# Patient Record
Sex: Male | Born: 1960 | Race: White | Hispanic: No | Marital: Single | Smoking: Never smoker
Health system: Southern US, Community
[De-identification: ages and names within clinical notes are randomized; demographics above are authoritative.]

## PROBLEM LIST (undated history)

## (undated) DIAGNOSIS — G4730 Sleep apnea, unspecified: Secondary | ICD-10-CM

## (undated) HISTORY — PX: TONSILLECTOMY: SUR1361

---

## 2019-10-17 HISTORY — PX: TRANSTHORACIC ECHOCARDIOGRAM: SHX275

## 2019-10-20 HISTORY — PX: CARDIAC CATHETERIZATION: SHX172

## 2020-10-06 ENCOUNTER — Telehealth: Payer: Self-pay | Admitting: Specialist

## 2020-10-07 ENCOUNTER — Ambulatory Visit: Payer: Self-pay

## 2020-10-07 ENCOUNTER — Ambulatory Visit (INDEPENDENT_AMBULATORY_CARE_PROVIDER_SITE_OTHER): Payer: BC Managed Care – PPO | Admitting: Specialist

## 2020-10-07 ENCOUNTER — Encounter: Payer: Self-pay | Admitting: Specialist

## 2020-10-07 ENCOUNTER — Other Ambulatory Visit: Payer: Self-pay

## 2020-10-07 VITALS — BP 155/98 | HR 82 | Ht 66.0 in | Wt 250.0 lb

## 2020-10-07 DIAGNOSIS — M545 Low back pain, unspecified: Secondary | ICD-10-CM

## 2020-10-07 DIAGNOSIS — M5136 Other intervertebral disc degeneration, lumbar region: Secondary | ICD-10-CM | POA: Diagnosis not present

## 2020-10-07 DIAGNOSIS — M1712 Unilateral primary osteoarthritis, left knee: Secondary | ICD-10-CM

## 2020-10-07 DIAGNOSIS — M47816 Spondylosis without myelopathy or radiculopathy, lumbar region: Secondary | ICD-10-CM

## 2020-10-07 DIAGNOSIS — M25562 Pain in left knee: Secondary | ICD-10-CM | POA: Diagnosis not present

## 2020-10-07 MED ORDER — DICLOFENAC SODIUM 1 % EX GEL: 4.0000 g | Freq: Four times a day (QID) | CUTANEOUS | 5 refills | Status: DC

## 2020-10-07 NOTE — Progress Notes (Signed)
Office Visit Note   Patient: Malik James           Date of Birth: 1961-01-29           MRN: [id number] Visit Date: 10/07/2020              Requested by: No referring provider defined for this encounter. PCP: No primary care provider on file.   Assessment & Plan: Visit Diagnoses:  1. Left low back pain, unspecified chronicity, unspecified whether sciatica present   2. Left knee pain, unspecified chronicity   3. Unilateral primary osteoarthritis, left knee   4. Degenerative disc disease, lumbar   5. Spondylosis without myelopathy or radiculopathy, lumbar region     Plan: Avoid frequent bending and stooping  No lifting greater than 10 lbs. May use ice or moist heat for pain. Weight loss is of benefit. Best medication for lumbar disc disease is arthritis medications like motrin, celebrex and naprosyn. Exercise is important to improve your indurance and does allow people to function better inspite of back pain. Plan: Knee is suffering from osteoarthritis, only real proven treatments are Weight loss, NSIADs like diclofenac and exercise. Well padded shoes help. Ice the knee that is suffering from osteoarthritis, only real proven treatments are Weight loss, NSIADs like diclofenac and exercise. Well padded shoes help. Ice the knee 2-3 times a day 15-20 mins at a time 3 times a day 15-20 mins at a time. Hot showers in the AM.  Injection with steroid may be of benefit. Hemp CBD capsules refined and does not cause test to be positive for [email or web address] 5,000-7,000 mg per bottle, 60 capsules per bottle, take one capsule twice a day. Cane in the left hand to use with left leg weight bearing. Follow-Up Instructions: No follow-ups on file.    Follow-Up Instructions: Return in about 6 weeks (around 11/18/2020).   Orders:  Orders Placed This Encounter  Procedures  . XR Lumbar Spine 2-3 Views  . XR Knee 1-2 Views Left   Meds ordered this encounter  Medications  . diclofenac Sodium  (VOLTAREN) 1 % GEL    Sig: Apply 4 g topically 4 (four) times daily.    Dispense:  350 g    Refill:  5      Procedures: No procedures performed   Clinical Data: No additional findings.   Subjective: Chief Complaint  Patient presents with  . Left Hip - Pain  . Left Knee - Pain    60 year old right handed male with history of left buttock pain and pain into the left knee. Had a cortisone injection about  3 years ago in [place] and that helped for about 3 weeks. The injection was by [facility] at [facility]. No numbness or tingling into the left leg. AM stiffness and pain. Both back and leg are painful with the first step, like getting out of the vehicle. He has same pain with prolong sitting and stooping at the dishes. No bowel or bladder difficulty. 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  Had left SI joint injection done 08/24/2020 and it lasted a couple of months and he reports was out of work with COVID and was not sure the shot was the reason for relief.  Pain in the knee is worsening so he wanted  to have both assessed. Has sleep apnea and is awaiting a CPAP machine, 4-6 weeks and has no night pain. No pain with bending stooping or lifting. Squatting and kneeling with pain left knee andwith Stair. Had cardiac catheterization done last year returned normal.     Review of Systems  Constitutional: Negative.   HENT: Negative.   Eyes: Negative.   Respiratory: Negative.   Cardiovascular: Negative.   Gastrointestinal: Negative.   Endocrine: Negative.   Genitourinary: Negative.   Musculoskeletal: Negative.   Skin: Negative.   Allergic/Immunologic: Negative.   Neurological: Negative.   Hematological: Negative.   Psychiatric/Behavioral: Negative.      Objective: Vital Signs: BP (!) 155/98 (BP Location:  Left Arm, Patient Position: Sitting)   Pulse 82   Ht 5\' 6"  (1.676 m)   Wt 250 lb (113.4 kg)   BMI 40.35 kg/m   Physical Exam Constitutional:      Appearance: He is well-developed and well-nourished.  HENT:     Head: Normocephalic and atraumatic.  Eyes:     Extraocular Movements: EOM normal.     Pupils: Pupils are equal, round, and reactive to light.  Pulmonary:     Effort: Pulmonary effort is normal.     Breath sounds: Normal breath sounds.  Abdominal:     General: Bowel sounds are normal.     Palpations: Abdomen is soft.  Musculoskeletal:     Cervical back: Normal range of motion and neck supple.     Left knee:     Instability Tests: Medial McMurray test positive. Lateral McMurray test negative.  Skin:    General: Skin is warm and dry.  Neurological:     Mental Status: He is alert and oriented to person, place, and time.  Psychiatric:        Mood and Affect: Mood and affect normal.        Behavior: Behavior normal.        Thought Content: Thought content normal.        Judgment: Judgment normal.     Left Knee Exam   Tenderness  The patient is experiencing tenderness in the medial joint line and medial retinaculum.  Range of Motion  Extension:  -5 abnormal  Flexion: 130   Tests  McMurray:  Medial - positive Lateral - negative Varus: positive Valgus: negative Lachman:  Anterior - negative    Posterior - negative   Left Hip Exam   Comments:  Decreased left hip flexion and IR.    Back Exam   Tenderness  The patient is experiencing tenderness in the lumbar.  Range of Motion  Extension: abnormal  Flexion: abnormal  Lateral bend right: normal  Rotation right: normal  Rotation left: normal   Muscle Strength  Right Quadriceps:  5/5  Left Quadriceps:  5/5  Right Hamstrings:  5/5  Left Hamstrings:  5/5   Reflexes  Patellar: 0/4 Achilles: 0/4      Specialty Comments:  No specialty comments available.  Imaging: No results found.   PMFS  History: There are no problems to display for this patient.  No past medical history on file.  No family history on file.   Social History   Occupational History  . Not on file  Tobacco Use  . Smoking status: Never Smoker  . Smokeless tobacco: Never Used  Substance and Sexual Activity  . Alcohol use: Not Currently  . Drug use: Not Currently  . Sexual activity: Not on file

## 2020-10-07 NOTE — Patient Instructions (Signed)
Plan: Avoid frequent bending and stooping  No lifting greater than 10 lbs. May use ice or moist heat for pain. Weight loss is of benefit. Best medication for lumbar disc disease is arthritis medications like motrin, celebrex and naprosyn. Exercise is important to improve your indurance and does allow people to function better inspite of back pain.  Journal for Nurse Practitioners, 15(4), [phone]. Retrieved May 20, 2018 from http://clinicalkey.com/nursing">  Knee Exercises Ask your health care provider which exercises are safe for you. Do exercises exactly as told by your health care provider and adjust them as directed. It is normal to feel mild stretching, pulling, tightness, or discomfort as you do these exercises. Stop right away if you feel sudden pain or your pain gets worse. Do not begin these exercises until told by your health care provider. Stretching and range-of-motion exercises These exercises warm up your muscles and joints and improve the movement and flexibility of your knee. These exercises also help to relieve pain and swelling. Knee extension, prone Lie on your abdomen (prone position) on a bed. Place your left / right knee just beyond the edge of the surface so your knee is not on the bed. You can put a towel under your left / right thigh just above your kneecap for comfort. Relax your leg muscles and allow gravity to straighten your knee (extension). You should feel a stretch behind your left / right knee. Hold this position for __________ seconds. Scoot up so your knee is supported between repetitions. Repeat __________ times. Complete this exercise __________ times a day. Knee flexion, active Lie on your back with both legs straight. If this causes back discomfort, bend your left / right knee so your foot is flat on the floor. Slowly slide your left / right heel back toward your buttocks. Stop when you feel a gentle stretch in the front of your knee or thigh  (flexion). Hold this position for __________ seconds. Slowly slide your left / right heel back to the starting position. Repeat __________ times. Complete this exercise __________ times a day.   Quadriceps stretch, prone Lie on your abdomen on a firm surface, such as a bed or padded floor. Bend your left / right knee and hold your ankle. If you cannot reach your ankle or pant leg, loop a belt around your foot and grab the belt instead. Gently pull your heel toward your buttocks. Your knee should not slide out to the side. You should feel a stretch in the front of your thigh and knee (quadriceps). Hold this position for __________ seconds. Repeat __________ times. Complete this exercise __________ times a day.   Hamstring, supine Lie on your back (supine position). Loop a belt or towel over the ball of your left / right foot. The ball of your foot is on the walking surface, right under your toes. Straighten your left / right knee and slowly pull on the belt to raise your leg until you feel a gentle stretch behind your knee (hamstring). Do not let your knee bend while you do this. Keep your other leg flat on the floor. Hold this position for __________ seconds. Repeat __________ times. Complete this exercise __________ times a day. Strengthening exercises These exercises build strength and endurance in your knee. Endurance is the ability to use your muscles for a long time, even after they get tired. Quadriceps, isometric This exercise stretches the muscles in front of your thigh (quadriceps) without moving your knee joint (isometric). Lie on your back  with your left / right leg extended and your other knee bent. Put a rolled towel or small pillow under your knee if told by your health care provider. Slowly tense the muscles in the front of your left / right thigh. You should see your kneecap slide up toward your hip or see increased dimpling just above the knee. This motion will push the back  of the knee toward the floor. For __________ seconds, hold the muscle as tight as you can without increasing your pain. Relax the muscles slowly and completely. Repeat __________ times. Complete this exercise __________ times a day.   Straight leg raises This exercise stretches the muscles in front of your thigh (quadriceps) and the muscles that move your hips (hip flexors). Lie on your back with your left / right leg extended and your other knee bent. Tense the muscles in the front of your left / right thigh. You should see your kneecap slide up or see increased dimpling just above the knee. Your thigh may even shake a bit. Keep these muscles tight as you raise your leg 4-6 inches (10-15 cm) off the floor. Do not let your knee bend. Hold this position for __________ seconds. Keep these muscles tense as you lower your leg. Relax your muscles slowly and completely after each repetition. Repeat __________ times. Complete this exercise __________ times a day. Hamstring, isometric Lie on your back on a firm surface. Bend your left / right knee about __________ degrees. Dig your left / right heel into the surface as if you are trying to pull it toward your buttocks. Tighten the muscles in the back of your thighs (hamstring) to "dig" as hard as you can without increasing any pain. Hold this position for __________ seconds. Release the tension gradually and allow your muscles to relax completely for __________ seconds after each repetition. Repeat __________ times. Complete this exercise __________ times a day. Hamstring curls If told by your health care provider, do this exercise while wearing ankle weights. Begin with __________ lb weights. Then increase the weight by 1 lb (0.5 kg) increments. Do not wear ankle weights that are more than __________ lb. Lie on your abdomen with your legs straight. Bend your left / right knee as far as you can without feeling pain. Keep your hips flat against the  floor. Hold this position for __________ seconds. Slowly lower your leg to the starting position. Repeat __________ times. Complete this exercise __________ times a day.   Squats This exercise strengthens the muscles in front of your thigh and knee (quadriceps). Stand in front of a table, with your feet and knees pointing straight ahead. You may rest your hands on the table for balance but not for support. Slowly bend your knees and lower your hips like you are going to sit in a chair. Keep your weight over your heels, not over your toes. Keep your lower legs upright so they are parallel with the table legs. Do not let your hips go lower than your knees. Do not bend lower than told by your health care provider. If your knee pain increases, do not bend as low. Hold the squat position for __________ seconds. Slowly push with your legs to return to standing. Do not use your hands to pull yourself to standing. Repeat __________ times. Complete this exercise __________ times a day. Wall slides This exercise strengthens the muscles in front of your thigh and knee (quadriceps). Lean your back against a smooth wall or door, and  walk your feet out 18-24 inches (46-61 cm) from it. Place your feet hip-width apart. Slowly slide down the wall or door until your knees bend __________ degrees. Keep your knees over your heels, not over your toes. Keep your knees in line with your hips. Hold this position for __________ seconds. Repeat __________ times. Complete this exercise __________ times a day.   Straight leg raises This exercise strengthens the muscles that rotate the leg at the hip and move it away from your body (hip abductors). Lie on your side with your left / right leg in the top position. Lie so your head, shoulder, knee, and hip line up. You may bend your bottom knee to help you keep your balance. Roll your hips slightly forward so your hips are stacked directly over each other and your left /  right knee is facing forward. Leading with your heel, lift your top leg 4-6 inches (10-15 cm). You should feel the muscles in your outer hip lifting. Do not let your foot drift forward. Do not let your knee roll toward the ceiling. Hold this position for __________ seconds. Slowly return your leg to the starting position. Let your muscles relax completely after each repetition. Repeat __________ times. Complete this exercise __________ times a day.   Straight leg raises This exercise stretches the muscles that move your hips away from the front of the pelvis (hip extensors). Lie on your abdomen on a firm surface. You can put a pillow under your hips if that is more comfortable. Tense the muscles in your buttocks and lift your left / right leg about 4-6 inches (10-15 cm). Keep your knee straight as you lift your leg. Hold this position for __________ seconds. Slowly lower your leg to the starting position. Let your leg relax completely after each repetition. Repeat __________ times. Complete this exercise __________ times a day. This information is not intended to replace advice given to you by your health care provider. Make sure you discuss any questions you have with your health care provider. Document Revised: 05/21/2018 Document Reviewed: 05/21/2018 Elsevier Patient Education  [place].  Back Injury Prevention Back injuries can be very painful. They can also be difficult to heal. After having one back injury, you are more likely to have another one again. It is important to learn how to avoid injuring or re-injuring your back. The following tips can help you to prevent a back injury. What actions can I take to prevent back injuries? Nutrition changes Talk with your health care provider about your overall diet, and especially about foods that strengthen your bones.  Ask your health care provider how much calcium and vitamin D you need each day. These nutrients help to prevent  weakening of the bones (osteoporosis). Osteoporosis can cause broken (fractured) bones, which lead to back pain.  Eat foods that are good sources of calcium. These include dairy products, green leafy vegetables, and products that have had calcium added to them (fortified).  Eat foods that are good sources of vitamin D. These include milk and foods that are fortified with vitamin D.  If needed, take supplements and vitamins as directed by your health care provider. Physical fitness Physical fitness strengthens your bones and your muscles. It also increases your balance and strength.  Exercise for 30 minutes per day on most days of the week, or as directed by your health care provider. Make sure to: ? Do aerobic exercises, such as walking, jogging, biking, or swimming. ? Do  exercises that increase balance and strength, such as tai chi and yoga. These can decrease your risk of falling and injuring your back. ? Do stretching exercises to help with flexibility. ? Develop strong abdominal muscles. Your abdominal muscles provide a lot of the support that your back needs.  Maintain a healthy weight. This helps to decrease your risk of a back injury. Good posture Prevent back injuries by developing and maintaining a good posture. To do this successfully:  Sit up and stand up straight. Avoid leaning forward when you sit or hunching over when you stand.  Choose chairs that have good low-back (lumbar) support.  If you work at a desk, sit close to it so you do not need to lean over. Keep your chin tucked in. Keep your neck drawn back, and keep your elbows bent at a right angle.  Sit high and close to the steering wheel when you drive. Add a lumbar support to your car seat, if needed.  Avoid sitting or standing in one position for very long. Take breaks to get up, stretch, and walk around at least one time every hour. Take breaks every hour if you are driving for long periods of time.  Sleep on your  side with your knees slightly bent, or sleep on your back with a pillow under your knees.         Lifting, twisting, and reaching Back injuries are more likely to occur when carrying loads and twisting at the same time. When you bend and lift, or reach for items that are high up in shelves, use positions that put less stress on your back.  Heavy lifting ? Avoid heavy lifting, especially the kind of heavy lifting that is repetitive. If you must do heavy lifting:  Stretch before lifting.  Work slowly.  Rest between lifts.  Use a tool such as a cart or a dolly to move objects.  Make several small trips instead of carrying one heavy load.  Ask for help when you need it, especially when moving big or heavy objects. ? Follow these steps when lifting:  Stand with your feet shoulder-width apart.  Get as close to the object as you can. Do not try to pick up a heavy object that is far from your body.  Use handles or lifting straps if they are available.  Bend at your knees. Squat down, but keep your heels off the floor.  Keep your shoulders pulled back, your chin tucked in, and your back straight.  Lift the object slowly while you tighten the muscles in your legs, abdomen, and buttocks. Keep the object as close to the center of your body as possible. ? Follow these steps when putting down a heavy load:  Stand with your feet shoulder-width apart.  Lower the object slowly while you tighten the muscles in your legs, abdomen, and buttocks. Keep the object as close to the center of your body as possible.  Keep your shoulders pulled back, your chin tucked in, and your back straight.  Bend at your knees. Squat down, but keep your heels off the floor.  Use handles or lifting straps if they are available.  Twisting and reaching ? Avoid lifting heavy objects above your waist. ? Do not twist at your waist while you are lifting or carrying a load. If you need to turn, move your feet. ? Do  not bend over without bending at your knees. ? Avoid reaching over your head, across a table, or for  an object on a high surface.   Other changes  Avoid wet floors and icy ground. Keep sidewalks clear of ice to prevent falls.  Do not sleep on a mattress that is too soft or too hard.  Put heavier objects on shelves at waist level, and put lighter objects on lower or higher shelves.  Find ways to decrease your stress, such as by exercising, getting a massage, or practicing relaxation techniques. Stress can build up in your muscles. Tense muscles are more vulnerable to injury.  Talk with your health care provider if you feel anxious or depressed. These conditions can make back pain worse.  Wear flat heel shoes with cushioned soles.  Use both shoulder straps when carrying a backpack.  Do not use any products that contain nicotine or tobacco, such as cigarettes and e-cigarettes. If you need help quitting, ask your health care provider.   Summary  Back injuries can be very painful and difficult to heal.  You can prevent injuring or re-injuring your back by making nutrition changes, working on being physically fit, developing a good posture, and lifting heavy objects in a safe way.  Making other changes can also help to prevent back injuries. These include eating a healthy diet, exercising regularly and maintaining a healthy weight. This information is not intended to replace advice given to you by your health care provider. Make sure you discuss any questions you have with your health care provider. Document Revised: 04/23/2019 Document Reviewed: 09/15/2017 Elsevier Patient Education  2021 [place]. Knee is suffering from osteoarthritis, only real proven treatments are Weight loss, NSIADs like diclofenac and exercise. Well padded shoes help. Ice the knee that is suffering from osteoarthritis, only real proven treatments are Weight loss, NSIADs like diclofenac and exercise. Well  padded shoes help. Ice the knee 2-3 times a day 15-20 mins at a time 3 times a day 15-20 mins at a time. Hot showers in the AM.  Injection with steroid may be of benefit. Hemp CBD capsules refined and does not cause test to be positive for [email or web address] 5,000-7,000 mg per bottle, 60 capsules per bottle, take one capsule twice a day. Cane in the left hand to use with left leg weight bearing. Follow-Up Instructions: No follow-ups on file.

## 2020-10-08 NOTE — Telephone Encounter (Signed)
E 

## 2020-10-28 ENCOUNTER — Other Ambulatory Visit: Payer: Self-pay | Admitting: Specialist

## 2020-10-28 ENCOUNTER — Telehealth: Payer: Self-pay | Admitting: Specialist

## 2020-10-28 MED ORDER — TRAMADOL HCL 50 MG PO TABS: 50.0000 mg | ORAL_TABLET | Freq: Four times a day (QID) | ORAL | 0 refills | Status: AC | PRN

## 2020-10-28 NOTE — Telephone Encounter (Signed)
Please advise 

## 2020-10-28 NOTE — Telephone Encounter (Signed)
Patient called advised his left knee is swollen and he need something for the inflammation. Patient said the pain is keeping him up at night.The number to contact patient is [phone]

## 2020-10-28 NOTE — Telephone Encounter (Signed)
Sent an Rx for tramadol to the only pharmacy list, mail order. jen

## 2020-10-29 ENCOUNTER — Telehealth: Payer: Self-pay | Admitting: Specialist

## 2020-10-29 NOTE — Telephone Encounter (Signed)
I called and lmom that I cancelled the order thru mail order and called it in to [facility]

## 2020-10-29 NOTE — Telephone Encounter (Signed)
Patient called asked if the Rx for Tramadol can be sent to [facility] in [place] on [address].  Patient asked if the other order can be canceled? The number to contact patient is [phone]

## 2020-10-29 NOTE — Telephone Encounter (Signed)
I cancelled the order thru mail order and I called the medicine in to [facility] on [address] [place].  As it would take a week for him to get.

## 2020-11-12 ENCOUNTER — Encounter: Payer: Self-pay | Admitting: Specialist

## 2020-11-12 ENCOUNTER — Other Ambulatory Visit: Payer: Self-pay

## 2020-11-12 ENCOUNTER — Ambulatory Visit: Payer: BC Managed Care – PPO | Admitting: Specialist

## 2020-11-12 ENCOUNTER — Ambulatory Visit: Payer: Self-pay

## 2020-11-12 VITALS — BP 154/81 | HR 75 | Ht 66.0 in | Wt 257.0 lb

## 2020-11-12 DIAGNOSIS — M1712 Unilateral primary osteoarthritis, left knee: Secondary | ICD-10-CM | POA: Diagnosis not present

## 2020-11-12 DIAGNOSIS — M5136 Other intervertebral disc degeneration, lumbar region: Secondary | ICD-10-CM

## 2020-11-12 DIAGNOSIS — M25562 Pain in left knee: Secondary | ICD-10-CM | POA: Diagnosis not present

## 2020-11-12 DIAGNOSIS — M23312 Other meniscus derangements, anterior horn of medial meniscus, left knee: Secondary | ICD-10-CM

## 2020-11-12 MED ORDER — HYDROCODONE-ACETAMINOPHEN 5-325 MG PO TABS: 1.0000 | ORAL_TABLET | Freq: Four times a day (QID) | ORAL | 0 refills | Status: DC | PRN

## 2020-11-12 NOTE — Progress Notes (Signed)
Office Visit Note   Patient: Malik James           Date of Birth: 1961-04-03           MRN: [id number] Visit Date: 11/12/2020              Requested by: No referring provider defined for this encounter. PCP: No primary care provider on file.   Assessment & Plan: Visit Diagnoses:  1. Left knee pain, unspecified chronicity   2. Unilateral primary osteoarthritis, left knee   3. Degenerative disc disease, lumbar     Plan: Knee is suffering from osteoarthritis, only real proven treatments are Weight loss, NSIADs like diclofenac and exercise. Well padded shoes help. Ice the knee that is suffering from osteoarthritis, only real proven treatments are Weight loss, NSIADs like diclofenac and exercise. Well padded shoes help. Ice the knee 2-3 times a day 15-20 mins at a time.-3 times a day 15-20 mins at a time. Hot showers in the AM.  Injection with steroid may be of benefit. Hemp CBD capsules, amazon.[employer] 5,000-7,000 mg per bottle, 60 capsules per bottle, take one capsule twice a day. Cane in the left hand to use with left leg weight bearing. Follow-Up Instructions: No follow-ups on file.  MRI left knee Schedule for return appointment with Drs. Darlys Gales or Magnus Ivan for consideration of left knee replacement vs arthroscopic surgical consideration. Follow-Up Instructions: No follow-ups on file.   Orders:  Orders Placed This Encounter  Procedures  . XR KNEE 3 VIEW LEFT   No orders of the defined types were placed in this encounter.     Procedures: No procedures performed   Clinical Data: No additional findings.   Subjective: Chief Complaint  Patient presents with  . Lower Back - Follow-up  . Left Knee - Follow-up    60 year old male with history of left knee pain. He is seeing [facility] and is to undergo left ESI this week by Dr. Delanna Notice, will have a  A selective nerve root block done. He had a left knee cortisone injection at his last visit and reports while it was numb  there was relief but when the numbing med wore off the pain returned. He has left medial joint line pain in the knee with pain with first standing and walking. He has night pain and when he gets up in the AM with stiffness. Radiographs show moderately severe left medial knee joint line narrowiing with subchondral sclerosis of the left medial tibial plateau and medial tibial joint line osteophytes.     Review of Systems  Constitutional: Negative.   HENT: Negative.   Eyes: Negative.   Respiratory: Negative.   Cardiovascular: Negative.   Gastrointestinal: Negative.   Endocrine: Negative.   Genitourinary: Negative.   Musculoskeletal: Negative.   Skin: Negative.   Allergic/Immunologic: Negative.   Neurological: Negative.   Hematological: Negative.   Psychiatric/Behavioral: Negative.      Objective: Vital Signs: BP (!) 154/81 (BP Location: Left Arm, Patient Position: Sitting)   Pulse 75   Ht 5\' 6"  (1.676 m)   Wt 257 lb (116.6 kg)   BMI 41.48 kg/m   Physical Exam Constitutional:      Appearance: He is well-developed.  HENT:     Head: Normocephalic and atraumatic.  Eyes:     Pupils: Pupils are equal, round, and reactive to light.  Pulmonary:     Effort: Pulmonary effort is normal.     Breath sounds: Normal breath sounds.  Abdominal:     General: Bowel sounds are normal.     Palpations: Abdomen is soft.  Musculoskeletal:     Cervical back: Normal range of motion and neck supple.     Left knee: No effusion.     Instability Tests: Medial McMurray test positive. Lateral McMurray test negative.  Skin:    General: Skin is warm and dry.  Neurological:     Mental Status: He is alert and oriented to person, place, and time.  Psychiatric:        Behavior: Behavior normal.        Thought Content: Thought content normal.        Judgment: Judgment normal.     Left Knee Exam   Tenderness  The patient is experiencing tenderness in the medial joint line and medial  retinaculum.  Range of Motion  Extension:  -10 abnormal  Flexion: 120   Tests  McMurray:  Medial - positive Lateral - negative Varus: positive Valgus: negative Lachman:  Anterior - negative    Posterior - negative Drawer:  Anterior - negative     Posterior - negative Pivot shift: 1+ Patellar apprehension: 1+  Other  Erythema: absent Scars: absent Sensation: normal Swelling: mild Effusion: no effusion present  Comments:  Tenderness over the left medial joint line. Varus deformity left knee.       Specialty Comments:  No specialty comments available.  Imaging: No results found.   PMFS History: There are no problems to display for this patient.  History reviewed. No pertinent past medical history.  History reviewed. No pertinent family history.  History reviewed. No pertinent surgical history. Social History   Occupational History  . Not on file  Tobacco Use  . Smoking status: Never Smoker  . Smokeless tobacco: Never Used  Substance and Sexual Activity  . Alcohol use: Not Currently  . Drug use: Not Currently  . Sexual activity: Not on file

## 2020-11-18 ENCOUNTER — Ambulatory Visit: Payer: BC Managed Care – PPO | Admitting: Specialist

## 2020-11-20 ENCOUNTER — Ambulatory Visit (HOSPITAL_BASED_OUTPATIENT_CLINIC_OR_DEPARTMENT_OTHER)
Admission: RE | Admit: 2020-11-20 | Discharge: 2020-11-20 | Disposition: A | Discharge status: Home / Self Care | Payer: BC Managed Care – PPO | Source: Ambulatory Visit | Attending: Specialist | Admitting: Specialist

## 2020-11-20 ENCOUNTER — Other Ambulatory Visit: Payer: Self-pay

## 2020-11-20 DIAGNOSIS — M23312 Other meniscus derangements, anterior horn of medial meniscus, left knee: Secondary | ICD-10-CM

## 2020-11-20 DIAGNOSIS — M1712 Unilateral primary osteoarthritis, left knee: Secondary | ICD-10-CM | POA: Diagnosis present

## 2020-11-20 DIAGNOSIS — M25562 Pain in left knee: Secondary | ICD-10-CM | POA: Diagnosis present

## 2020-11-25 ENCOUNTER — Encounter: Payer: Self-pay | Admitting: Orthopaedic Surgery

## 2020-11-25 ENCOUNTER — Ambulatory Visit: Payer: BC Managed Care – PPO | Admitting: Orthopaedic Surgery

## 2020-11-25 VITALS — Ht 66.0 in | Wt 253.0 lb

## 2020-11-25 DIAGNOSIS — M1712 Unilateral primary osteoarthritis, left knee: Secondary | ICD-10-CM | POA: Insufficient documentation

## 2020-11-25 DIAGNOSIS — S83242A Other tear of medial meniscus, current injury, left knee, initial encounter: Secondary | ICD-10-CM | POA: Insufficient documentation

## 2020-11-25 NOTE — Progress Notes (Addendum)
Office Visit Note   Patient: Malik James           Date of Birth: 09-28-1960           MRN: [id number] Visit Date: 11/25/2020              Requested by: No referring provider defined for this encounter. PCP: Pcp, No   Assessment & Plan: Visit Diagnoses:  1. Primary osteoarthritis of left knee   2. Acute medial meniscal tear, left, initial encounter     Plan: X-rays demonstrate degenerative changes of the medial compartment with joint space narrowing and periarticular spurring.  MRI shows medial meniscal tear of the meniscal root with partial extrusion.  There is surrounding bony edema of the medial femoral condyle and medial tibial plateau.  Based on findings and clinical evaluation I think the meniscus tear is only partially responsible for the symptoms.  These findings were reviewed with the patient and treatment options were discussed to include knee arthroscopy and partial medial meniscectomy and chondroplasty and possible risks of incomplete pain relief and need for future total knee replacement versus a total knee replacement which would give more reliable pain relief but we would require longer recovery and recuperation and time out of work.  Especially with the care that he has to provide for his son he will need to take time to think about his options.  He has my card when he is ready to make his decision.  Follow-Up Instructions: Return if symptoms worsen or fail to improve.   Orders:  No orders of the defined types were placed in this encounter.  No orders of the defined types were placed in this encounter.     Procedures: No procedures performed   Clinical Data: No additional findings.   Subjective: Chief Complaint  Patient presents with  . Left Knee - Pain    Malik James is a very pleasant 60 year old gentleman referral from Dr. Weston Brass for evaluation of chronic left knee pain with recent MRI showing tricompartmental degenerative changes as well as acute medial  meniscal tear.  Patient has had chronic left knee pain and has had previous injections with diminishing relief with the recent ones.  He takes oxycodone at times.  He does report worsening locking and popping with activity.  He has been having constant sharp shooting pain with start up pain and locking.  Denies any injuries.  He feels pain throughout the knee.  He does have a son who is full care from a prior TBI.  He provides pretty much all the care for his son.  He is a [employer].   Review of Systems  Constitutional: Negative.   All other systems reviewed and are negative.    Objective: Vital Signs: Ht 5\' 6"  (1.676 m)   Wt 253 lb (114.8 kg)   BMI 40.84 kg/m   Physical Exam Vitals and nursing note reviewed.  Constitutional:      Appearance: He is well-developed.  HENT:     Head: Normocephalic and atraumatic.  Eyes:     Pupils: Pupils are equal, round, and reactive to light.  Pulmonary:     Effort: Pulmonary effort is normal.  Abdominal:     Palpations: Abdomen is soft.  Musculoskeletal:        General: Normal range of motion.     Cervical back: Neck supple.  Skin:    General: Skin is warm.  Neurological:     Mental Status: He is alert and  oriented to person, place, and time.  Psychiatric:        Behavior: Behavior normal.        Thought Content: Thought content normal.        Judgment: Judgment normal.     Ortho Exam Left knee shows no joint effusion or joint line tenderness.  Significant patellofemoral crepitus with range of motion.  Collaterals and cruciates are stable.  Pain at the medial joint line with McMurray testing.  He has tenderness along the medial joint line.  Specialty Comments:  No specialty comments available.  Imaging: No results found.   PMFS History: Patient Active Problem List   Diagnosis Date Noted  . Primary osteoarthritis of left knee 11/25/2020  . Acute medial meniscal tear, left, initial encounter 11/25/2020   History reviewed. No  pertinent past medical history.  History reviewed. No pertinent family history.  History reviewed. No pertinent surgical history. Social History   Occupational History  . Not on file  Tobacco Use  . Smoking status: Never Smoker  . Smokeless tobacco: Never Used  Substance and Sexual Activity  . Alcohol use: Not Currently  . Drug use: Not Currently  . Sexual activity: Not on file

## 2020-12-06 ENCOUNTER — Ambulatory Visit (INDEPENDENT_AMBULATORY_CARE_PROVIDER_SITE_OTHER): Payer: BC Managed Care – PPO | Admitting: Specialist

## 2020-12-06 ENCOUNTER — Other Ambulatory Visit: Payer: Self-pay

## 2020-12-06 ENCOUNTER — Encounter: Payer: Self-pay | Admitting: Specialist

## 2020-12-06 VITALS — BP 131/76 | HR 86 | Ht 66.0 in | Wt 253.0 lb

## 2020-12-06 DIAGNOSIS — S83242A Other tear of medial meniscus, current injury, left knee, initial encounter: Secondary | ICD-10-CM | POA: Diagnosis not present

## 2020-12-06 DIAGNOSIS — M23312 Other meniscus derangements, anterior horn of medial meniscus, left knee: Secondary | ICD-10-CM | POA: Diagnosis not present

## 2020-12-06 NOTE — Progress Notes (Addendum)
Office Visit Note   Patient: Malik James           Date of Birth: 1961-01-15           MRN: [id number] Visit Date: 12/06/2020              Requested by: No referring provider defined for this encounter. PCP: Pcp, No   Assessment & Plan: Visit Diagnoses: No diagnosis found.  Plan:The main ways of treat osteoarthritis, that are found to be success. Weight loss helps to decrease pain. Exercise is important to maintaining cartilage and thickness and strengthening. NSAIDs like motrin, tylenol, alleve are meds decreasing the inflamation. Ice is okay  In afternoon and evening and hot shower in the am Please call a month ahead of follow up appt so we can order new Synvisc One material. The main ways of treat osteoarthritis, that are found to be success. Weight loss helps to decrease pain. Exercise is important to maintaining cartilage and thickness and strengthening. NSAIDs like diclofenac gel and oral diclofenac, motrin, tylenol, alleve are meds decreasing the inflamation. Ice is okay  In afternoon and evening and hot shower in the am You may wish to consider arthroscopic treatment of the torn cartilage first and see if this is able to relieve Pain enough so that you can live with the arthritis pain which is chronic but the risk is that in patients with arthritis changes trimming the cartilage or treating the meniscal tear is not always successful in relieving the pain.   Follow-Up Instructions: No follow-ups on file.   Orders:  No orders of the defined types were placed in this encounter.  No orders of the defined types were placed in this encounter.     Procedures: No procedures performed   Clinical Data: No additional findings.   Subjective: Chief Complaint  Patient presents with  . 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 He has an MRI with left medial meniscal tear in the  posterior horn and the study also show DJD. He has seen Dr. Roda Shutters and is here for discussion concerning arthroscopy vs left TKR. He has night pain and pain with hyperflexionand full extension of the left knee. There is swelling with exercise and increased activity. He has popping, catching and intermittant locking of the right knee.    Review of Systems  Constitutional: Negative.   HENT: Negative.   Eyes: Negative.   Respiratory: Negative.   Cardiovascular: Negative.   Gastrointestinal: Negative.   Endocrine: Negative.   Genitourinary: Negative.   Musculoskeletal: Negative.   Skin: Negative.   Allergic/Immunologic: Negative.   Neurological: Negative.   Hematological: Negative.   Psychiatric/Behavioral: Negative.      Objective: Vital Signs: BP 131/76   Pulse 86   Ht 5\' 6"  (1.676 m)   Wt 253 lb (114.8 kg)   BMI 40.84 kg/m   Physical Exam Constitutional:      Appearance: He is well-developed.  HENT:     Head: Normocephalic and atraumatic.  Eyes:     Pupils: Pupils are equal, round, and reactive to light.  Pulmonary:     Effort: Pulmonary effort is normal.     Breath sounds: Normal breath sounds.  Abdominal:     General: Bowel sounds are normal.     Palpations: Abdomen is soft.  Musculoskeletal:  General: Normal range of motion.     Cervical back: Normal range of motion and neck supple.     Left knee:     Instability Tests: Medial McMurray test positive and lateral McMurray test positive.  Skin:    General: Skin is warm and dry.  Neurological:     Mental Status: He is alert and oriented to person, place, and time.  Psychiatric:        Behavior: Behavior normal.        Thought Content: Thought content normal.        Judgment: Judgment normal.     Left Knee Exam   Tenderness  The patient is experiencing tenderness in the medial joint line and medial retinaculum.  Range of Motion  Extension: -5  Flexion: 120   Tests  McMurray:  Medial - positive  Lateral - positive Valgus: positive Lachman:  Anterior - negative    Posterior - negative Drawer:  Anterior - negative     Posterior - negative Pivot shift: negative Patellar apprehension: negative  Other  Erythema: absent Scars: absent Sensation: normal Pulse: present Swelling: mild  Comments:  Left medial meniscus tear       Specialty Comments:  No specialty comments available.  Imaging: No results found.   PMFS History: Patient Active Problem List   Diagnosis Date Noted  . Primary osteoarthritis of left knee 11/25/2020  . Acute medial meniscal tear, left, initial encounter 11/25/2020   History reviewed. No pertinent past medical history.  History reviewed. No pertinent family history.  History reviewed. No pertinent surgical history. Social History   Occupational History  . Not on file  Tobacco Use  . Smoking status: Never Smoker  . Smokeless tobacco: Never Used  Substance and Sexual Activity  . Alcohol use: Not Currently  . Drug use: Not Currently  . Sexual activity: Not on file

## 2020-12-06 NOTE — Patient Instructions (Signed)
Plan:The main ways of treat osteoarthritis, that are found to be success. Weight loss helps to decrease pain. Exercise is important to maintaining cartilage and thickness and strengthening. NSAIDs like motrin, tylenol, alleve are meds decreasing the inflamation. Ice is okay  In afternoon and evening and hot shower in the am Please call a month ahead of follow up appt so we can order new Synvisc One material. The main ways of treat osteoarthritis, that are found to be success. Weight loss helps to decrease pain. Exercise is important to maintaining cartilage and thickness and strengthening. NSAIDs like diclofenac gel and oral diclofenac, motrin, tylenol, alleve are meds decreasing the inflamation. Ice is okay  In afternoon and evening and hot shower in the am You may wish to consider arthroscopic treatment of the torn cartilage first and see if this is able to relieve Pain enough so that you can live with the arthritis pain which is chronic but the risk is that in patients with arthritis changes trimming the cartilage or treating the meniscal tear is not always successful in relieving the pain.

## 2020-12-13 ENCOUNTER — Other Ambulatory Visit: Payer: Self-pay | Admitting: Physician Assistant

## 2020-12-13 MED ORDER — ONDANSETRON HCL 4 MG PO TABS: 4.0000 mg | ORAL_TABLET | Freq: Three times a day (TID) | ORAL | 0 refills | Status: DC | PRN

## 2020-12-13 MED ORDER — HYDROCODONE-ACETAMINOPHEN 5-325 MG PO TABS
1.0000 | ORAL_TABLET | Freq: Three times a day (TID) | ORAL | 0 refills | Status: DC | PRN
Start: 2020-12-13 — End: 2021-06-17

## 2020-12-16 ENCOUNTER — Encounter: Payer: Self-pay | Admitting: Orthopaedic Surgery

## 2020-12-16 DIAGNOSIS — S83242A Other tear of medial meniscus, current injury, left knee, initial encounter: Secondary | ICD-10-CM | POA: Diagnosis not present

## 2020-12-16 DIAGNOSIS — M2342 Loose body in knee, left knee: Secondary | ICD-10-CM | POA: Diagnosis not present

## 2020-12-17 ENCOUNTER — Encounter: Payer: Self-pay | Admitting: Orthopaedic Surgery

## 2020-12-17 ENCOUNTER — Telehealth: Payer: Self-pay

## 2020-12-17 DIAGNOSIS — M2342 Loose body in knee, left knee: Secondary | ICD-10-CM | POA: Insufficient documentation

## 2020-12-17 NOTE — Telephone Encounter (Signed)
Patient called he wants to know if Dr.Xu is setting up physical therapy to come out to his home or will he be referred to a practice call back:[phone]

## 2020-12-20 ENCOUNTER — Telehealth: Payer: Self-pay | Admitting: Orthopaedic Surgery

## 2020-12-20 ENCOUNTER — Other Ambulatory Visit: Payer: Self-pay

## 2020-12-20 NOTE — Telephone Encounter (Signed)
Please advise. OOW for how long?

## 2020-12-20 NOTE — Telephone Encounter (Signed)
He should not need any PT but we'll evaluate for that at his first appointment.

## 2020-12-20 NOTE — Telephone Encounter (Signed)
Patient aware.

## 2020-12-20 NOTE — Telephone Encounter (Signed)
Patient request that the out of work note be emailed to him @ [email or web address].

## 2020-12-20 NOTE — Telephone Encounter (Signed)
Emailed. Patient aware.   

## 2020-12-20 NOTE — Telephone Encounter (Signed)
Patient called and would like to pick up today an estimated return to work note. Please call patient at [phone].

## 2020-12-20 NOTE — Telephone Encounter (Signed)
Up to 6 weeks

## 2020-12-23 ENCOUNTER — Ambulatory Visit (INDEPENDENT_AMBULATORY_CARE_PROVIDER_SITE_OTHER): Payer: BC Managed Care – PPO | Admitting: Physician Assistant

## 2020-12-23 ENCOUNTER — Encounter: Payer: Self-pay | Admitting: Physician Assistant

## 2020-12-23 DIAGNOSIS — Z9889 Other specified postprocedural states: Secondary | ICD-10-CM

## 2020-12-23 NOTE — Progress Notes (Signed)
   Post-Op Visit Note   Patient: Malik James           Date of Birth: 07/06/1961           MRN: [id number] Visit Date: 12/23/2020 PCP: Pcp, No   Assessment & Plan:  Chief Complaint:  Chief Complaint  Patient presents with  . Left Knee - Routine Post Op   Visit Diagnoses:  1. S/P left knee arthroscopy     Plan: Patient is a very pleasant 60 year old gentleman who comes in today 1 week out left knee arthroscopic debridement medial meniscus and chondroplasty.  It was noted during operative intervention he had grade 4 changes to the medial and patellofemoral compartments.  He has actually been doing quite well.  He has taken occasional Norco but nothing more.  He is ambulating at times with crutches but primarily walks unassisted at home.  Examination of his left knee reveals fully healed surgical portals without complication.  Calf is soft nontender.  He is neurovascular intact distally.  Today, sutures were removed and Steri-Strips applied.  Intraoperative pictures reviewed.  Home exercise program provided.  He will increase activity as tolerated over the next 4 to 6 weeks.  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  He will follow-up with [place] in 5 weeks time for recheck.  Call with concerns or questions.  Follow-Up Instructions: Return in about 5 weeks (around 01/27/2021).   Orders:  No orders of the defined types were placed in this encounter.  No orders of the defined types were placed in this encounter.   Imaging: No new imaging  PMFS History: Patient Active Problem List   Diagnosis Date Noted  . Loose body in knee, left knee 12/17/2020  . Primary osteoarthritis of left knee 11/25/2020  . Acute medial meniscal tear, left, initial encounter 11/25/2020   History reviewed. No pertinent past medical history.  History reviewed. No pertinent family history.  History reviewed. No pertinent surgical history. Social History   Occupational History   . Not on file  Tobacco Use  . Smoking status: Never Smoker  . Smokeless tobacco: Never Used  Substance and Sexual Activity  . Alcohol use: Not Currently  . Drug use: Not Currently  . Sexual activity: Not on file

## 2021-01-13 ENCOUNTER — Encounter: Payer: Self-pay | Admitting: Orthopaedic Surgery

## 2021-01-13 ENCOUNTER — Ambulatory Visit (INDEPENDENT_AMBULATORY_CARE_PROVIDER_SITE_OTHER): Payer: BC Managed Care – PPO | Admitting: Orthopaedic Surgery

## 2021-01-13 DIAGNOSIS — S83242A Other tear of medial meniscus, current injury, left knee, initial encounter: Secondary | ICD-10-CM

## 2021-01-13 DIAGNOSIS — Z9889 Other specified postprocedural states: Secondary | ICD-10-CM

## 2021-01-13 MED ORDER — HYDROCODONE-ACETAMINOPHEN 5-325 MG PO TABS: 1.0000 | ORAL_TABLET | Freq: Every day | ORAL | 0 refills | Status: DC | PRN

## 2021-01-13 NOTE — Addendum Note (Signed)
Addended by: Mayra Reel on: 01/13/2021 10:21 AM   Modules accepted: Orders

## 2021-01-13 NOTE — Progress Notes (Signed)
   Post-Op Visit Note   Patient: Malik James           Date of Birth: 10-Aug-1961           MRN: [id number] Visit Date: 01/13/2021 PCP: Pcp, No   Assessment & Plan:  Chief Complaint:  Chief Complaint  Patient presents with  . Left Knee - Routine Post Op, Follow-up   Visit Diagnoses:  1. S/P left knee arthroscopy   2. Acute medial meniscal tear, left, initial encounter     Plan:   Malik James is 6 weeks status post left knee arthroscopy partial medial meniscectomy.  He is overall doing well.  He is mainly reporting start up stiffness and discomfort but the sharp stabbing pain is completely gone.  He is very happy overall.  Left knee shows fully healed surgical scars.  No joint effusion.  Adequate range of motion and strength.  Normal ambulation and gait.  At this point Malik James has recovered quite well from the surgery.  He will continue to increase activity as tolerated.  He is happy that the sharp stabbing pain is gone.  He does have some residual weakness and start up stiffness therefore I think he would benefit from being out of work until the 20th of this month so that he can continue to do his rehab exercises.  Follow-Up Instructions: No follow-ups on file.   Orders:  No orders of the defined types were placed in this encounter.  No orders of the defined types were placed in this encounter.   Imaging: No results found.  PMFS History: Patient Active Problem List   Diagnosis Date Noted  . Loose body in knee, left knee 12/17/2020  . Primary osteoarthritis of left knee 11/25/2020  . Acute medial meniscal tear, left, initial encounter 11/25/2020   History reviewed. No pertinent past medical history.  History reviewed. No pertinent family history.  History reviewed. No pertinent surgical history. Social History   Occupational History  . Not on file  Tobacco Use  . Smoking status: Never Smoker  . Smokeless tobacco: Never Used  Substance and Sexual Activity  .  Alcohol use: Not Currently  . Drug use: Not Currently  . Sexual activity: Not on file

## 2021-01-14 ENCOUNTER — Telehealth: Payer: Self-pay | Admitting: Orthopaedic Surgery

## 2021-01-14 NOTE — Telephone Encounter (Signed)
Refill on hydrocodone

## 2021-01-16 MED ORDER — HYDROCODONE-ACETAMINOPHEN 5-325 MG PO TABS: 1.0000 | ORAL_TABLET | Freq: Every day | ORAL | 0 refills | Status: DC | PRN

## 2021-01-16 NOTE — Addendum Note (Signed)
Addended by: Mayra Reel on: 01/16/2021 09:04 PM   Modules accepted: Orders

## 2021-01-27 ENCOUNTER — Encounter: Payer: BC Managed Care – PPO | Admitting: Orthopaedic Surgery

## 2021-02-11 ENCOUNTER — Telehealth: Payer: Self-pay | Admitting: Orthopaedic Surgery

## 2021-02-11 ENCOUNTER — Other Ambulatory Visit: Payer: Self-pay | Admitting: Physician Assistant

## 2021-02-11 NOTE — Telephone Encounter (Signed)
/  9 weeks s/p knee scope with Dr. Roda Shutters. Pt has follow up appt in office / requesting rx for pain medication please advise.

## 2021-02-11 NOTE — Telephone Encounter (Signed)
Pt called stating he's in a lot of pain, and he has an appt on 02/22/21 but he would like to know if he can have something called in? Pt would like a CB with an answer.   [phone]

## 2021-02-11 NOTE — Telephone Encounter (Signed)
Too far out from his surgery and at his last visit was doing well . May see if he can move up appointment to next week.

## 2021-02-11 NOTE — Telephone Encounter (Signed)
I called pt and lm on vm to advise of message below. Advised that I would forward this message to [facility] assistant to address on Tuesday and see if there was anything else that they wanted to do.

## 2021-02-15 NOTE — Telephone Encounter (Signed)
Please see below.

## 2021-02-16 MED ORDER — TRAMADOL HCL 50 MG PO TABS: 50.0000 mg | ORAL_TABLET | Freq: Every day | ORAL | 0 refills | Status: DC | PRN

## 2021-02-16 NOTE — Telephone Encounter (Signed)
Pt aware.

## 2021-02-16 NOTE — Telephone Encounter (Signed)
I sent tramadol.  If you do not mind forwarding medication refills to [facility].  Thanks.

## 2021-02-22 ENCOUNTER — Other Ambulatory Visit: Payer: Self-pay

## 2021-02-22 ENCOUNTER — Ambulatory Visit (INDEPENDENT_AMBULATORY_CARE_PROVIDER_SITE_OTHER): Payer: BC Managed Care – PPO | Admitting: Orthopaedic Surgery

## 2021-02-22 ENCOUNTER — Encounter: Payer: Self-pay | Admitting: Orthopaedic Surgery

## 2021-02-22 DIAGNOSIS — Z9889 Other specified postprocedural states: Secondary | ICD-10-CM | POA: Diagnosis not present

## 2021-02-22 DIAGNOSIS — M1712 Unilateral primary osteoarthritis, left knee: Secondary | ICD-10-CM

## 2021-02-22 DIAGNOSIS — S83242A Other tear of medial meniscus, current injury, left knee, initial encounter: Secondary | ICD-10-CM | POA: Diagnosis not present

## 2021-02-22 MED ORDER — METHYLPREDNISOLONE ACETATE 40 MG/ML IJ SUSP
40.0000 mg | INTRAMUSCULAR | Status: AC | PRN
  Administered 2021-02-22: 40 mg via INTRA_ARTICULAR

## 2021-02-22 MED ORDER — LIDOCAINE HCL 1 % IJ SOLN
2.0000 mL | INTRAMUSCULAR | Status: AC | PRN
  Administered 2021-02-22: 2 mL

## 2021-02-22 MED ORDER — IBUPROFEN 800 MG PO TABS: 800.0000 mg | ORAL_TABLET | Freq: Three times a day (TID) | ORAL | 2 refills | Status: DC | PRN

## 2021-02-22 MED ORDER — BUPIVACAINE HCL 0.5 % IJ SOLN
2.0000 mL | INTRAMUSCULAR | Status: AC | PRN
  Administered 2021-02-22: 2 mL via INTRA_ARTICULAR

## 2021-02-22 NOTE — Progress Notes (Addendum)
   Office Visit Note   Patient: Malik James           Date of Birth: [birth year]           MRN: [id number] Visit Date: 02/22/2021              Requested by: No referring provider defined for this encounter. PCP: Pcp, No   Assessment & Plan: Visit Diagnoses:  1. S/P left knee arthroscopy   2. Acute medial meniscal tear, left, initial encounter   3. Primary osteoarthritis of left knee     Plan: Based on findings I think he is symptomatic from his severe degenerative arthritis.  Fortunately his meniscal tear pain is gone but he understands that we will do everything we can to help him manage the arthritis pain.  I sent in a prescription for Advil.  We injected with cortisone today.  Provided with a [place].  We will order a medial unloader brace for him.  Due to his thigh to calf ratio he will need a custom brace.  He will also make efforts at weight loss.  Follow-Up Instructions: Return if symptoms worsen or fail to improve.   Orders:  No orders of the defined types were placed in this encounter.  Meds ordered this encounter  Medications   ibuprofen (ADVIL) 800 MG tablet    Sig: Take 1 tablet (800 mg total) by mouth every 8 (eight) hours as needed.    Dispense:  30 tablet    Refill:  2      Procedures: Large Joint Inj: L knee on 02/22/2021 6:57 PM Details: 22 G needle Medications: 2 mL bupivacaine 0.5 %; 2 mL lidocaine 1 %; 40 mg methylPREDNISolone acetate 40 MG/ML Outcome: tolerated well, no immediate complications Patient was prepped and draped in the usual sterile fashion.      Clinical Data: No additional findings.   Subjective: Chief Complaint  Patient presents with   Left Knee - Routine Post Op    Tristram is 10-week status post left knee arthroscopy partial medial meniscectomy.  He is reporting a different type of pain on the medial anterior portion of the knee.  He reports start up pain and stiffness.  Feels like it catches at times and there is increased  pain with walking and standing after sitting.  He takes tramadol as needed.   Review of Systems   Objective: Vital Signs: There were no vitals taken for this visit.  Physical Exam  Ortho Exam Left knee shows fully healed surgical scars.  Negative Mc[place]ray.  Slight medial joint line tenderness.  Patellofemoral crepitus with range of motion. Specialty Comments:  No specialty comments available.  Imaging: No results found.   PMFS History: Patient Active Problem List   Diagnosis Date Noted   Loose body in knee, left knee 12/17/2020   Primary osteoarthritis of left knee 11/25/2020   Acute medial meniscal tear, left, initial encounter 11/25/2020   History reviewed. No pertinent past medical history.  History reviewed. No pertinent family history.  History reviewed. No pertinent surgical history. Social History   Occupational History   Not on file  Tobacco Use   Smoking status: Never   Smokeless tobacco: Never  Substance and Sexual Activity   Alcohol use: Not Currently   Drug use: Not Currently   Sexual activity: Not on file

## 2021-05-31 ENCOUNTER — Ambulatory Visit: Payer: Self-pay

## 2021-05-31 ENCOUNTER — Other Ambulatory Visit: Payer: Self-pay

## 2021-05-31 ENCOUNTER — Encounter: Payer: Self-pay | Admitting: Orthopaedic Surgery

## 2021-05-31 ENCOUNTER — Ambulatory Visit (INDEPENDENT_AMBULATORY_CARE_PROVIDER_SITE_OTHER): Payer: BC Managed Care – PPO | Admitting: Orthopaedic Surgery

## 2021-05-31 DIAGNOSIS — M1712 Unilateral primary osteoarthritis, left knee: Secondary | ICD-10-CM

## 2021-05-31 NOTE — Progress Notes (Signed)
Office Visit Note   Patient: Malik James           Date of Birth: 1961/02/05           MRN: [id number] Visit Date: 05/31/2021              Requested by: No referring provider defined for this encounter. PCP: Pcp, No   Assessment & Plan: Visit Diagnoses:  1. Primary osteoarthritis of left knee     Plan: Impression is end-stage left knee DJD mainly of the medial and patellofemoral compartments.  Unfortunately Malik James did not receive much benefit from the knee scope or the cortisone injection.  Treatment options were again discussed to include nonsurgical versus surgical.  After careful consideration of his options and understands the risk benefits and rehab recovery of a total knee replacement Malik James has elected to move forward with scheduling for this.  Questions encouraged and answered.  Forward to treating him in the operating theater.  Follow-Up Instructions: No follow-ups on file.   Orders:  Orders Placed This Encounter  Procedures   XR Knee 1-2 Views Left   No orders of the defined types were placed in this encounter.     Procedures: No procedures performed   Clinical Data: No additional findings.   Subjective: Chief Complaint  Patient presents with   Left Knee - Pain    Malik James is a 60 year old gentleman here for evaluation of chronic left knee pain.  Malik James underwent a left knee arthroscopy partial medial meniscectomy on 12/16/2020.  Malik James has had continued pain and locking symptoms since the surgery.  Will we also did a cortisone injection in mid July which really did not help.  Malik James feels that the pain is due to the arthritis and DJD.  Malik James is having constant pain and trouble with any daily activities and is very limited in that regard.  Malik James does not have any quality life and has   Review of Systems  Constitutional: Negative.   All other systems reviewed and are negative.   Objective: Vital Signs: There were no vitals taken for this visit.  Physical Exam Vitals and  nursing note reviewed.  Constitutional:      Appearance: Malik James is well-developed.  Pulmonary:     Effort: Pulmonary effort is normal.  Abdominal:     Palpations: Abdomen is soft.  Skin:    General: Skin is warm.  Neurological:     Mental Status: Malik James is alert and oriented to person, place, and time.  Psychiatric:        Behavior: Behavior normal.        Thought Content: Thought content normal.        Judgment: Judgment normal.    Ortho Exam  Left knee shows full healed surgical scars.  No joint effusion.  Range of motion 5 to 115 degrees with mild pain and crepitus.  Collaterals and cruciates are stable.  Slight medial joint line tenderness.  Specialty Comments:  No specialty comments available.  Imaging: No results found.   PMFS History: Patient Active Problem List   Diagnosis Date Noted   Loose body in knee, left knee 12/17/2020   Primary osteoarthritis of left knee 11/25/2020   Acute medial meniscal tear, left, initial encounter 11/25/2020   History reviewed. No pertinent past medical history.  History reviewed. No pertinent family history.  History reviewed. No pertinent surgical history. Social History   Occupational History   Not on file  Tobacco Use   Smoking  status: Never   Smokeless tobacco: Never  Substance and Sexual Activity   Alcohol use: Not Currently   Drug use: Not Currently   Sexual activity: Not on file

## 2021-06-20 NOTE — Progress Notes (Signed)
Surgical Instructions    Your procedure is scheduled on Friday, November 11th, 2022.   Report to [facility] Main Entrance "A" at 10:50 A.M., then check in with the Admitting office.  Call this number if you have problems the morning of surgery:  [phone]   If you have any questions prior to your surgery date call [phone]: Open Monday-Friday 8am-4pm    Remember:  Do not eat after midnight the night before your surgery  You may drink clear liquids until 09:50 the morning of your surgery.   Clear liquids allowed are: Water, Non-Citrus Juices (without pulp), Carbonated Beverages, Clear Tea, Black Coffee ONLY (NO MILK, CREAM OR POWDERED CREAMER of any kind), and Gatorade    Take these medicines the morning of surgery with A SIP OF WATER: NONE   As of today, STOP taking any Aspirin (unless otherwise instructed by your surgeon) Aleve, Naproxen, Ibuprofen, Motrin, Advil, Goody's, BC's, all herbal medications, fish oil, and all vitamins.    After your COVID test   You are not required to quarantine however you are required to wear a well-fitting mask when you are out and around people not in your household.  If your mask becomes wet or soiled, replace with a new one.  Wash your hands often with soap and water for 20 seconds or clean your hands with an alcohol-based hand sanitizer that contains at least 60% alcohol.  Do not share personal items.  Notify your provider: if you are in close contact with someone who has COVID  or if you develop a fever of 100.4 or greater, sneezing, cough, sore throat, shortness of breath or body aches.    The day of surgery:          Do not wear jewelry  Do not wear lotions, powders, colognes, or deodorant. Men may shave face and neck. Do not bring valuables to the hospital.              [facility] is not responsible for any belongings or valuables.  Do NOT Smoke (Tobacco/Vaping)  24 hours prior to your procedure  If you use a CPAP at  night, you may bring your mask for your overnight stay.   Contacts, glasses, hearing aids, dentures or partials may not be worn into surgery, please bring cases for these belongings   For patients admitted to the hospital, discharge time will be determined by your treatment team.   Patients discharged the day of surgery will not be allowed to drive home, and someone needs to stay with them for 24 hours.  NO VISITORS WILL BE ALLOWED IN PRE-OP WHERE PATIENTS ARE PREPPED FOR SURGERY.  ONLY 1 SUPPORT PERSON MAY BE PRESENT IN THE WAITING ROOM WHILE YOU ARE IN SURGERY.  IF YOU ARE TO BE ADMITTED, ONCE YOU ARE IN YOUR ROOM YOU WILL BE ALLOWED TWO (2) VISITORS. 1 (ONE) VISITOR MAY STAY OVERNIGHT BUT MUST ARRIVE TO THE ROOM BY 8pm.  Minor children may have two parents present. Special consideration for safety and communication needs will be reviewed on a case by case basis.  Special instructions:    Oral Hygiene is also important to reduce your risk of infection.  Remember - BRUSH YOUR TEETH THE MORNING OF SURGERY WITH YOUR REGULAR TOOTHPASTE   [place]- Preparing For Surgery  Before surgery, you can play an important role. Because skin is not sterile, your skin needs to be as free of germs as possible. You can reduce the number of  germs on your skin by washing with CHG (chlorahexidine gluconate) Soap before surgery.  CHG is an antiseptic cleaner which kills germs and bonds with the skin to continue killing germs even after washing.     Please do not use if you have an allergy to CHG or antibacterial soaps. If your skin becomes reddened/irritated stop using the CHG.  Do not shave (including legs and underarms) for at least 48 hours prior to first CHG shower. It is OK to shave your face.  Please follow these instructions carefully.     Shower the NIGHT BEFORE SURGERY and the MORNING OF SURGERY with CHG Soap.   If you chose to wash your hair, wash your hair first as usual with your normal shampoo.  After you shampoo, rinse your hair and body thoroughly to remove the shampoo.  Then [employer] and genitals (private parts) with your normal soap and rinse thoroughly to remove soap.  After that Use CHG Soap as you would any other liquid soap. You can apply CHG directly to the skin and wash gently with a scrungie or a clean washcloth.   Apply the CHG Soap to your body ONLY FROM THE NECK DOWN.  Do not use on open wounds or open sores. Avoid contact with your eyes, ears, mouth and genitals (private parts). Wash Face and genitals (private parts)  with your normal soap.   Wash thoroughly, paying special attention to the area where your surgery will be performed.  Thoroughly rinse your body with warm water from the neck down.  DO NOT shower/wash with your normal soap after using and rinsing off the CHG Soap.  Pat yourself dry with a CLEAN TOWEL.  Wear CLEAN PAJAMAS to bed the night before surgery  Place CLEAN SHEETS on your bed the night before your surgery  DO NOT SLEEP WITH PETS.   Day of Surgery:  Take a shower with CHG soap. Wear Clean/Comfortable clothing the morning of surgery Do not apply any deodorants/lotions.   Remember to brush your teeth WITH YOUR REGULAR TOOTHPASTE.   Please read over the following fact sheets that you were given.

## 2021-06-21 ENCOUNTER — Encounter (HOSPITAL_COMMUNITY): Payer: Self-pay

## 2021-06-21 ENCOUNTER — Encounter (HOSPITAL_COMMUNITY)
Admission: RE | Admit: 2021-06-21 | Discharge: 2021-06-21 | Disposition: A | Discharge status: Home / Self Care | Payer: BC Managed Care – PPO | Source: Ambulatory Visit | Attending: Orthopaedic Surgery | Admitting: Orthopaedic Surgery

## 2021-06-21 ENCOUNTER — Other Ambulatory Visit: Payer: Self-pay

## 2021-06-21 ENCOUNTER — Telehealth: Payer: Self-pay | Admitting: Orthopaedic Surgery

## 2021-06-21 ENCOUNTER — Other Ambulatory Visit: Payer: Self-pay | Admitting: Physician Assistant

## 2021-06-21 VITALS — BP 136/89 | HR 66 | Temp 97.9°F | Resp 17 | Ht 66.0 in | Wt 229.4 lb

## 2021-06-21 DIAGNOSIS — Z01818 Encounter for other preprocedural examination: Secondary | ICD-10-CM | POA: Insufficient documentation

## 2021-06-21 DIAGNOSIS — M1712 Unilateral primary osteoarthritis, left knee: Secondary | ICD-10-CM | POA: Diagnosis not present

## 2021-06-21 DIAGNOSIS — M47817 Spondylosis without myelopathy or radiculopathy, lumbosacral region: Secondary | ICD-10-CM | POA: Diagnosis not present

## 2021-06-21 DIAGNOSIS — R7303 Prediabetes: Secondary | ICD-10-CM | POA: Diagnosis not present

## 2021-06-21 DIAGNOSIS — D591 Autoimmune hemolytic anemia, unspecified: Secondary | ICD-10-CM | POA: Diagnosis not present

## 2021-06-21 DIAGNOSIS — Z6837 Body mass index (BMI) 37.0-37.9, adult: Secondary | ICD-10-CM | POA: Diagnosis not present

## 2021-06-21 DIAGNOSIS — E669 Obesity, unspecified: Secondary | ICD-10-CM | POA: Diagnosis not present

## 2021-06-21 DIAGNOSIS — Z20822 Contact with and (suspected) exposure to covid-19: Secondary | ICD-10-CM | POA: Insufficient documentation

## 2021-06-21 DIAGNOSIS — I2510 Atherosclerotic heart disease of native coronary artery without angina pectoris: Secondary | ICD-10-CM | POA: Diagnosis not present

## 2021-06-21 DIAGNOSIS — M5136 Other intervertebral disc degeneration, lumbar region: Secondary | ICD-10-CM | POA: Diagnosis not present

## 2021-06-21 DIAGNOSIS — G4733 Obstructive sleep apnea (adult) (pediatric): Secondary | ICD-10-CM | POA: Insufficient documentation

## 2021-06-21 DIAGNOSIS — M5137 Other intervertebral disc degeneration, lumbosacral region: Secondary | ICD-10-CM | POA: Insufficient documentation

## 2021-06-21 DIAGNOSIS — M47816 Spondylosis without myelopathy or radiculopathy, lumbar region: Secondary | ICD-10-CM | POA: Insufficient documentation

## 2021-06-21 DIAGNOSIS — Z9989 Dependence on other enabling machines and devices: Secondary | ICD-10-CM | POA: Diagnosis not present

## 2021-06-21 HISTORY — DX: Sleep apnea, unspecified: G47.30

## 2021-06-21 LAB — CBC
HCT: 51.2 % (ref 39.0–52.0)
Hemoglobin: 16.3 g/dL (ref 13.0–17.0)
MCH: 27.6 pg (ref 26.0–34.0)
MCHC: 31.8 g/dL (ref 30.0–36.0)
MCV: 86.8 fL (ref 80.0–100.0)
Platelets: ADEQUATE 10*3/uL (ref 150–400)
RBC: 5.9 MIL/uL — ABNORMAL HIGH (ref 4.22–5.81)
RDW: 12.4 % (ref 11.5–15.5)
WBC: 8.9 10*3/uL (ref 4.0–10.5)
nRBC: 0 % (ref 0.0–0.2)

## 2021-06-21 LAB — GLUCOSE, CAPILLARY: Glucose-Capillary: 111 mg/dL — ABNORMAL HIGH (ref 70–99)

## 2021-06-21 LAB — BASIC METABOLIC PANEL
Anion gap: 10 (ref 5–15)
BUN: 13 mg/dL (ref 6–20)
CO2: 21 mmol/L — ABNORMAL LOW (ref 22–32)
Calcium: 8.7 mg/dL — ABNORMAL LOW (ref 8.9–10.3)
Chloride: 103 mmol/L (ref 98–111)
Creatinine, Ser: 1.01 mg/dL (ref 0.61–1.24)
GFR, Estimated: >60 mL/min (ref >60)
Glucose, Bld: 97 mg/dL (ref 70–99)
Potassium: 5.5 mmol/L — ABNORMAL HIGH (ref 3.5–5.1)
Sodium: 134 mmol/L — ABNORMAL LOW (ref 135–145)

## 2021-06-21 LAB — SARS CORONAVIRUS 2 (TAT 6-24 HRS): SARS Coronavirus 2: NEGATIVE

## 2021-06-21 LAB — SURGICAL PCR SCREEN
MRSA, PCR: NEGATIVE
Staphylococcus aureus: NEGATIVE

## 2021-06-21 NOTE — Telephone Encounter (Signed)
Patient called asked if he can get a note stating how long he is going to be out of work for his employer. The number to contact patient is [phone]

## 2021-06-21 NOTE — Progress Notes (Addendum)
PCP - [facility] Cardiologist - Gwen Her, MD  PPM/ICD - denies Device Orders - n/a Rep Notified - n/a  Chest x-ray - 08/23/2020 EKG - 06/21/2021 Stress Test - denies ECHO - 10/17/2019 Cardiac Cath - 10/20/2019  Sleep Study - yes CPAP - yes  Fasting Blood Sugar - patient is not checking CBG at home. Patient denied pre-diabetes CBG today - 111  Blood Thinner Instructions: n/a  Aspirin Instructions: Patient was instructed: As of today, STOP taking any Aspirin (unless otherwise instructed by your surgeon) Aleve, Naproxen, Ibuprofen, Motrin, Advil, Goody's, BC's, all herbal medications, fish oil, and all vitamins.  ERAS Protcol - yes PRE-SURGERY Ensure or G2- no  COVID TEST- done in PAT on 06/21/2021   Anesthesia review: yes. Patient denied any cardiac history but he had done in 2021 cardiac catheterization and echocardiogram. Patient verbalized that these tests were done because of his family history. There is a doctor's note that mention patient having history of SOB, A Fib, CAD. In PAT patient denied any discomfort, VS stable. Also patient denied having pre-diabetes. K 5.5 in PAT. Dr. Roda Shutters was notified.  Patient denies shortness of breath, fever, cough and chest pain at PAT appointment   All instructions explained to the patient, with a verbal understanding of the material. Patient agrees to go over the instructions while at home for a better understanding. Patient also instructed to self quarantine after being tested for COVID-19. The opportunity to ask questions was provided.

## 2021-06-21 NOTE — Telephone Encounter (Signed)
3 months

## 2021-06-21 NOTE — Progress Notes (Signed)
Abnormal lab in PAT: K 5.5. Dr. Roda Shutters office was called and a message was left to [facility], the orthopedic scheduler to call back PAT. A message was send to Dr. Roda Shutters via staff message.

## 2021-06-21 NOTE — Progress Notes (Signed)
Not sure if I am reading the result correctly or not but was there hemolysis on the CBC?

## 2021-06-22 ENCOUNTER — Telehealth: Payer: Self-pay | Admitting: Orthopaedic Surgery

## 2021-06-22 ENCOUNTER — Other Ambulatory Visit: Payer: Self-pay | Admitting: Physician Assistant

## 2021-06-22 ENCOUNTER — Encounter (HOSPITAL_COMMUNITY): Payer: Self-pay

## 2021-06-22 MED ORDER — ASPIRIN EC 81 MG PO TBEC: 81.0000 mg | DELAYED_RELEASE_TABLET | Freq: Two times a day (BID) | ORAL | 0 refills | Status: AC

## 2021-06-22 MED ORDER — DOCUSATE SODIUM 100 MG PO CAPS: 100.0000 mg | ORAL_CAPSULE | Freq: Every day | ORAL | 2 refills | Status: AC | PRN

## 2021-06-22 MED ORDER — ONDANSETRON HCL 4 MG PO TABS: 4.0000 mg | ORAL_TABLET | Freq: Three times a day (TID) | ORAL | 0 refills | Status: AC | PRN

## 2021-06-22 MED ORDER — OXYCODONE-ACETAMINOPHEN 5-325 MG PO TABS: 1.0000 | ORAL_TABLET | Freq: Four times a day (QID) | ORAL | 0 refills | Status: DC | PRN

## 2021-06-22 MED ORDER — METHOCARBAMOL 500 MG PO TABS: 500.0000 mg | ORAL_TABLET | Freq: Two times a day (BID) | ORAL | 2 refills | Status: DC | PRN

## 2021-06-22 NOTE — Telephone Encounter (Signed)
[employer] forms received. To [employer]. 

## 2021-06-22 NOTE — Telephone Encounter (Signed)
Work note made. Patient aware.

## 2021-06-22 NOTE — Telephone Encounter (Signed)
1-2 weeks.

## 2021-06-22 NOTE — Telephone Encounter (Signed)
Patient requesting note to be written out of work following his surgery

## 2021-06-22 NOTE — Telephone Encounter (Signed)
Pt submitted medical release form, and $25.00 check. [employer] faxed forms. Accepted 06/22/2021

## 2021-06-22 NOTE — Progress Notes (Signed)
Anesthesia Chart Review:  Case: [id number] Date/Time: 06/24/21 1234   Procedure: LEFT TOTAL KNEE ARTHROPLASTY (Left: Knee)   Anesthesia type: Spinal   Pre-op diagnosis: left knee degenerative joint disease   [facility]: [facility] ROOM 06 / [facility] OR   Surgeons: Tarry Kos, MD       DISCUSSION: Patient is a 60 year old male scheduled for the above procedure.   History includes never smoker, severe OSA (uses CPAP), COVID-19 (03/03/20). A1c 6.2% 10/29/20 is consistent with pre-diabetes (A1c 5.9-6.2% 07/16/17-10/29/20). Minimal CAD by March 2021 cath. BMI is consistent with obesity.  Preoperative labs showed normal Creatinine of 1.01 with K 5.5 but likely elevated due to hemolysis. He does not have a history of thrombocytopenia, but PLT count did not result (clumped) but count appeared adequate. Spinal anesthesia is anticipated and last platelet count is ~ 10 months ago, so will order a repeat CBC in hopes to get a definitive platelet count.   06/21/21 presurgical COVID-19 test negative. Anesthesia team to evaluate on the day of surgery.    VS: BP 136/89   Pulse 66   Temp 36.6 C (Oral)   Resp 17   Ht 5\' 6"  (1.676 m)   Wt 104.1 kg   SpO2 100%   BMI 37.03 kg/m    PROVIDERS: , NP is PCP [facility])  [facility], DO is neurologist for OSA [facility]) [facility], MD is cardiologist [facility]). Last visit 01/09/20.  He was seen for follow-up chest pain and family history of CAD.  10/2019 cardiac cath showing minimal CAD.  Risk factor modifications recommended including healthy healthy diet, weight loss, hypertension control, and statin for dyslipidemia. I do not see any mention of afib history in cardiology notes reviewed.    LABS: Preoperative labs noted. K 5.5, but hemolyzed. Cr 1.01. H/H 16.3/51.2, platelets were clumped. PLT count 320K on 09/03/20. See DISCUSSION. (all labs ordered are listed, but only abnormal results are displayed)  Labs Reviewed  BASIC METABOLIC PANEL  - Abnormal; Notable for the following components:      Result Value   Sodium 134 (*)    Potassium 5.5 (*)    CO2 21 (*)    Calcium 8.7 (*)    All other components within normal limits  CBC - Abnormal; Notable for the following components:   RBC 5.90 (*)    All other components within normal limits  GLUCOSE, CAPILLARY - Abnormal; Notable for the following components:   Glucose-Capillary 111 (*)    All other components within normal limits  SARS CORONAVIRUS 2 (TAT 6-24 HRS)  SURGICAL PCR SCREEN    NOCTURNAL POLYSOMNOGRAM w/PAP TITRATION 09/13/20 ([facility]): DIAGNOSIS: Severe (AHI 130) Obstructive Sleep Apnea   IMAGES: MRI Left knee 11/20/20: IMPRESSION: 1. Complex tear of the posterior horn of the medial meniscus with complete radial tear at the root attachment and vertical tear propagating medially. 2. Tricompartmental osteoarthritis, moderate within the medial compartment where there is full-thickness cartilage loss. 3. Small joint effusion. 4. Mild patellar tendinosis.   CXR 09/03/20 ([facility]): FINDINGS: Mediastinal and hilar contours are within normal limits. Right lung is clear. Mild left basilar atelectasis. No effusions or pneumothorax. IMPRESSION:  1. Mild left basilar atelectasis.   Xray L-spine 06/09/20 ([facility]): Impression: 1.  No acute fracture. No spondylolisthesis.  2.  Mild degenerative disc changes at T12-L1, L4-L5, and L5-S1.  3.  Moderate facet arthropathy below L3.   EKG: 06/21/21:  Normal sinus rhythm with sinus  arrhythmia Normal ECG Confirmed by Lance Muss [phone]) on 06/21/2021 6:02:48 PM   CV: Cardiac cath 10/20/19 ([facility]): Hemodynamics:      Aortic pressure 125/70 mmHg, LV pressure 130/15 mmHg, LVEDP 15 mmHg   Coronary anatomy:  1. Left Main coronary artery: Normal  2. Left anterior descending artery: Large, normal.  D1 is a small to  medium caliber vessel which has an ostial 30% disease    3. Left circumflex artery: Large,  minor luminal irregularities.  OM  branches free of disease    4. Right coronary artery: Large, dominant, normal  LV gram performed showed normal left ventricular systolic function left  ventricular ejection fraction of more than 55%.  Trace mitral  regurgitation noted.  No significant gradient across aortic valve on  pullback.      CONCLUSIONS:  1.  Minimal nonobstructive CAD with normal LVEF.  2.  Noncardiac symptoms.     Echo 10/17/19 ([facility]): Impression: Left Ventricle: Systolic function is normal. EF: 60-65%. Doppler  parameters consistent with mild diastolic dysfunction    Tricuspid Valve: There is mild regurgitation.    Mitral Valve: There is mild regurgitation.   Past Medical History:  Diagnosis Date   Sleep apnea     Past Surgical History:  Procedure Laterality Date   CARDIAC CATHETERIZATION  10/20/2019   10/20/19 ([facility]): Normal LAD, LCx, RCA. 30% small-medium sized D1. Minor luminal irrgularities LCX. OM branches free of disease. LVEF > 55%. Trave MR, no gradient across AV.   TONSILLECTOMY     TRANSTHORACIC ECHOCARDIOGRAM  10/17/2019   TTE 10/17/19 ([facility]): LVEF 60-65%, mild diastolic dysfunction, mild TR/MR.    MEDICATIONS:  atorvastatin (LIPITOR) 20 MG tablet   diclofenac (VOLTAREN) 75 MG EC tablet   ibuprofen (ADVIL) 800 MG tablet   lisinopril (ZESTRIL) 20 MG tablet   Vitamin D, Ergocalciferol, (DRISDOL) 1.25 MG (50000 UNIT) CAPS capsule   No current facility-administered medications for this encounter.    Shonna Chock, PA-C Surgical Short Stay/Anesthesiology [facility] Phone [phone] [facility] Phone [phone] 06/22/2021 12:46 PM

## 2021-06-22 NOTE — Anesthesia Preprocedure Evaluation (Addendum)
Anesthesia Evaluation  Patient identified by MRN, date of birth, ID band Patient awake    Reviewed: Allergy & Precautions, NPO status , Patient's Chart, lab work & pertinent test results  Airway Mallampati: II  TM Distance: >3 FB Neck ROM: Full   Comment: Large neck circumference; redundant neck tissue Dental no notable dental hx.    Pulmonary sleep apnea and Continuous Positive Airway Pressure Ventilation ,    Pulmonary exam normal breath sounds clear to auscultation       Cardiovascular Exercise Tolerance: Good negative cardio ROS Normal cardiovascular exam Rhythm:Regular Rate:Normal     Neuro/Psych negative neurological ROS  negative psych ROS   GI/Hepatic negative GI ROS, Neg liver ROS,   Endo/Other  negative endocrine ROS  Renal/GU negative Renal ROS  negative genitourinary   Musculoskeletal  (+) Arthritis ,   Abdominal   Peds negative pediatric ROS (+)  Hematology negative hematology ROS (+)   Anesthesia Other Findings   Reproductive/Obstetrics                           Anesthesia Physical Anesthesia Plan  ASA: 2  Anesthesia Plan: MAC, Regional and Spinal   Post-op Pain Management:  Regional for Post-op pain   Induction: Intravenous  PONV Risk Score and Plan: Propofol infusion, TIVA, Treatment may vary due to age or medical condition, Midazolam and Ondansetron  Airway Management Planned: Natural Airway and Simple Face Mask  Additional Equipment: None  Intra-op Plan:   Post-operative Plan: Extubation in OR  Informed Consent: I have reviewed the patients History and Physical, chart, labs and discussed the procedure including the risks, benefits and alternatives for the proposed anesthesia with the patient or authorized representative who has indicated his/her understanding and acceptance.     Dental advisory given  Plan Discussed with: CRNA and  Anesthesiologist  Anesthesia Plan Comments: (PAT note written 06/22/2021 by Myra Gianotti, PA-C. 06/21/21 labs showed K 5.5, but specimen hemolyzed. Creatine 1.01. PLT were clumped, so CBC ordered for day of surgery.  )      Anesthesia Quick Evaluation

## 2021-06-24 ENCOUNTER — Observation Stay (HOSPITAL_COMMUNITY)
Admission: RE | Admit: 2021-06-24 | Discharge: 2021-06-25 | Disposition: A | Discharge status: Home / Self Care | Payer: BC Managed Care – PPO | Attending: Orthopaedic Surgery | Admitting: Orthopaedic Surgery

## 2021-06-24 ENCOUNTER — Encounter (HOSPITAL_COMMUNITY): Payer: Self-pay | Admitting: Orthopaedic Surgery

## 2021-06-24 ENCOUNTER — Ambulatory Visit (HOSPITAL_COMMUNITY): Payer: BC Managed Care – PPO | Admitting: Vascular Surgery

## 2021-06-24 ENCOUNTER — Encounter (HOSPITAL_COMMUNITY)
Admission: RE | Disposition: A | Discharge status: Home / Self Care | Payer: Self-pay | Source: Home / Self Care | Attending: Orthopaedic Surgery

## 2021-06-24 ENCOUNTER — Observation Stay (HOSPITAL_COMMUNITY): Payer: BC Managed Care – PPO

## 2021-06-24 ENCOUNTER — Other Ambulatory Visit: Payer: Self-pay

## 2021-06-24 DIAGNOSIS — M1712 Unilateral primary osteoarthritis, left knee: Principal | ICD-10-CM | POA: Diagnosis present

## 2021-06-24 DIAGNOSIS — R52 Pain, unspecified: Secondary | ICD-10-CM

## 2021-06-24 DIAGNOSIS — G4739 Other sleep apnea: Secondary | ICD-10-CM | POA: Insufficient documentation

## 2021-06-24 DIAGNOSIS — Z96652 Presence of left artificial knee joint: Secondary | ICD-10-CM

## 2021-06-24 HISTORY — PX: TOTAL KNEE ARTHROPLASTY: SHX125

## 2021-06-24 LAB — CBC WITH DIFFERENTIAL/PLATELET
Abs Immature Granulocytes: 0.05 10*3/uL (ref 0.00–0.07)
Basophils Absolute: 0.1 10*3/uL (ref 0.0–0.1)
Basophils Relative: 1 %
Eosinophils Absolute: 0.2 10*3/uL (ref 0.0–0.5)
Eosinophils Relative: 2 %
HCT: 48.2 % (ref 39.0–52.0)
Hemoglobin: 16.4 g/dL (ref 13.0–17.0)
Immature Granulocytes: 1 %
Lymphocytes Relative: 26 %
Lymphs Abs: 2.7 10*3/uL (ref 0.7–4.0)
MCH: 28.3 pg (ref 26.0–34.0)
MCHC: 34 g/dL (ref 30.0–36.0)
MCV: 83.2 fL (ref 80.0–100.0)
Monocytes Absolute: 0.8 10*3/uL (ref 0.1–1.0)
Monocytes Relative: 8 %
Neutro Abs: 6.6 10*3/uL (ref 1.7–7.7)
Neutrophils Relative %: 62 %
Platelets: 384 10*3/uL (ref 150–400)
RBC: 5.79 MIL/uL (ref 4.22–5.81)
RDW: 12.4 % (ref 11.5–15.5)
WBC: 10.5 10*3/uL (ref 4.0–10.5)
nRBC: 0 % (ref 0.0–0.2)

## 2021-06-24 LAB — PROTIME-INR
INR: 1 (ref 0.8–1.2)
Prothrombin Time: 13.1 seconds (ref 11.4–15.2)

## 2021-06-24 LAB — COMPREHENSIVE METABOLIC PANEL
ALT: 20 U/L (ref 0–44)
AST: 18 U/L (ref 15–41)
Albumin: 3.8 g/dL (ref 3.5–5.0)
Alkaline Phosphatase: 72 U/L (ref 38–126)
Anion gap: 10 (ref 5–15)
BUN: 11 mg/dL (ref 6–20)
CO2: 24 mmol/L (ref 22–32)
Calcium: 9.2 mg/dL (ref 8.9–10.3)
Chloride: 103 mmol/L (ref 98–111)
Creatinine, Ser: 0.94 mg/dL (ref 0.61–1.24)
GFR, Estimated: >60 mL/min (ref >60)
Glucose, Bld: 109 mg/dL — ABNORMAL HIGH (ref 70–99)
Potassium: 4 mmol/L (ref 3.5–5.1)
Sodium: 137 mmol/L (ref 135–145)
Total Bilirubin: 1.3 mg/dL — ABNORMAL HIGH (ref 0.3–1.2)
Total Protein: 6.7 g/dL (ref 6.5–8.1)

## 2021-06-24 LAB — URINALYSIS, ROUTINE W REFLEX MICROSCOPIC
Bilirubin Urine: NEGATIVE
Glucose, UA: NEGATIVE mg/dL
Hgb urine dipstick: NEGATIVE
Ketones, ur: NEGATIVE mg/dL
Leukocytes,Ua: NEGATIVE
Nitrite: NEGATIVE
Protein, ur: NEGATIVE mg/dL
Specific Gravity, Urine: 1.015 (ref 1.005–1.030)
pH: 5 (ref 5.0–8.0)

## 2021-06-24 LAB — APTT: aPTT: 30 seconds (ref 24–36)

## 2021-06-24 SURGERY — ARTHROPLASTY, KNEE, TOTAL
Anesthesia: Monitor Anesthesia Care | Site: Knee | Laterality: Left

## 2021-06-24 MED ORDER — OXYCODONE HCL 5 MG PO TABS
ORAL_TABLET | ORAL | Status: AC
  Filled 2021-06-24: qty 1

## 2021-06-24 MED ORDER — ONDANSETRON HCL 4 MG PO TABS: 4.0000 mg | ORAL_TABLET | Freq: Four times a day (QID) | ORAL | Status: DC | PRN

## 2021-06-24 MED ORDER — ORAL CARE MOUTH RINSE: 15.0000 mL | Freq: Once | OROMUCOSAL | Status: AC

## 2021-06-24 MED ORDER — PROMETHAZINE HCL 25 MG/ML IJ SOLN: 6.2500 mg | INTRAMUSCULAR | Status: DC | PRN

## 2021-06-24 MED ORDER — SODIUM CHLORIDE 0.9 % IV SOLN: INTRAVENOUS | Status: DC

## 2021-06-24 MED ORDER — VANCOMYCIN HCL 1000 MG IV SOLR
INTRAVENOUS | Status: DC | PRN
  Administered 2021-06-24: 1000 mg via TOPICAL

## 2021-06-24 MED ORDER — FENTANYL CITRATE (PF) 100 MCG/2ML IJ SOLN: 50.0000 ug | Freq: Once | INTRAMUSCULAR | Status: AC

## 2021-06-24 MED ORDER — ACETAMINOPHEN 325 MG PO TABS: 325.0000 mg | ORAL_TABLET | Freq: Four times a day (QID) | ORAL | Status: DC | PRN

## 2021-06-24 MED ORDER — SODIUM CHLORIDE 0.9 % IR SOLN
Status: DC | PRN
  Administered 2021-06-24: 1000 mL

## 2021-06-24 MED ORDER — LACTATED RINGERS IV SOLN: INTRAVENOUS | Status: DC

## 2021-06-24 MED ORDER — DEXAMETHASONE SODIUM PHOSPHATE 10 MG/ML IJ SOLN
10.0000 mg | Freq: Once | INTRAMUSCULAR | Status: AC
  Administered 2021-06-25: 10 mg via INTRAVENOUS
  Filled 2021-06-24: qty 1

## 2021-06-24 MED ORDER — 0.9 % SODIUM CHLORIDE (POUR BTL) OPTIME
TOPICAL | Status: DC | PRN
  Administered 2021-06-24: 1000 mL

## 2021-06-24 MED ORDER — MIDAZOLAM HCL 2 MG/2ML IJ SOLN: 1.0000 mg | Freq: Once | INTRAMUSCULAR | Status: AC

## 2021-06-24 MED ORDER — AMISULPRIDE (ANTIEMETIC) 5 MG/2ML IV SOLN: 10.0000 mg | Freq: Once | INTRAVENOUS | Status: DC | PRN

## 2021-06-24 MED ORDER — METOCLOPRAMIDE HCL 5 MG PO TABS: 5.0000 mg | ORAL_TABLET | Freq: Three times a day (TID) | ORAL | Status: DC | PRN

## 2021-06-24 MED ORDER — METOCLOPRAMIDE HCL 5 MG/ML IJ SOLN: 5.0000 mg | Freq: Three times a day (TID) | INTRAMUSCULAR | Status: DC | PRN

## 2021-06-24 MED ORDER — DOCUSATE SODIUM 100 MG PO CAPS
100.0000 mg | ORAL_CAPSULE | Freq: Two times a day (BID) | ORAL | Status: DC
  Administered 2021-06-24 – 2021-06-25 (×2): 100 mg via ORAL
  Filled 2021-06-24 (×2): qty 1

## 2021-06-24 MED ORDER — PHENOL 1.4 % MT LIQD: 1.0000 | OROMUCOSAL | Status: DC | PRN

## 2021-06-24 MED ORDER — PROPOFOL 500 MG/50ML IV EMUL
INTRAVENOUS | Status: DC | PRN
  Administered 2021-06-24: 180 ug/kg/min via INTRAVENOUS
  Administered 2021-06-24: 150 ug/kg/min via INTRAVENOUS

## 2021-06-24 MED ORDER — MENTHOL 3 MG MT LOZG: 1.0000 | LOZENGE | OROMUCOSAL | Status: DC | PRN

## 2021-06-24 MED ORDER — BUPIVACAINE HCL (PF) 0.5 % IJ SOLN
INTRAMUSCULAR | Status: DC | PRN
  Administered 2021-06-24: 30 mL

## 2021-06-24 MED ORDER — TRANEXAMIC ACID-NACL 1000-0.7 MG/100ML-% IV SOLN
1000.0000 mg | INTRAVENOUS | Status: AC
  Administered 2021-06-24: 1000 mg via INTRAVENOUS
  Filled 2021-06-24: qty 100

## 2021-06-24 MED ORDER — ACETAMINOPHEN 500 MG PO TABS
1000.0000 mg | ORAL_TABLET | Freq: Once | ORAL | Status: AC
  Administered 2021-06-24: 1000 mg via ORAL
  Filled 2021-06-24: qty 2

## 2021-06-24 MED ORDER — OXYCODONE HCL 5 MG/5ML PO SOLN: 5.0000 mg | Freq: Once | ORAL | Status: AC | PRN

## 2021-06-24 MED ORDER — BUPIVACAINE-MELOXICAM ER 200-6 MG/7ML IJ SOLN
INTRAMUSCULAR | Status: AC
  Filled 2021-06-24: qty 1

## 2021-06-24 MED ORDER — TRANEXAMIC ACID 1000 MG/10ML IV SOLN
2000.0000 mg | INTRAVENOUS | Status: DC
  Filled 2021-06-24: qty 20

## 2021-06-24 MED ORDER — METHOCARBAMOL 1000 MG/10ML IJ SOLN
500.0000 mg | Freq: Four times a day (QID) | INTRAVENOUS | Status: DC | PRN
  Filled 2021-06-24: qty 5

## 2021-06-24 MED ORDER — OXYCODONE HCL 5 MG PO TABS
5.0000 mg | ORAL_TABLET | Freq: Once | ORAL | Status: AC | PRN
  Administered 2021-06-24: 5 mg via ORAL

## 2021-06-24 MED ORDER — OXYCODONE HCL 5 MG PO TABS
5.0000 mg | ORAL_TABLET | ORAL | Status: DC | PRN
  Administered 2021-06-24 – 2021-06-25 (×3): 10 mg via ORAL
  Filled 2021-06-24 (×2): qty 2

## 2021-06-24 MED ORDER — ACETAMINOPHEN 500 MG PO TABS
1000.0000 mg | ORAL_TABLET | Freq: Four times a day (QID) | ORAL | Status: DC
  Administered 2021-06-24 – 2021-06-25 (×3): 1000 mg via ORAL
  Filled 2021-06-24 (×3): qty 2

## 2021-06-24 MED ORDER — PROPOFOL 10 MG/ML IV BOLUS
INTRAVENOUS | Status: DC | PRN
  Administered 2021-06-24 (×4): 50 mg via INTRAVENOUS

## 2021-06-24 MED ORDER — BUPIVACAINE-MELOXICAM ER 400-12 MG/14ML IJ SOLN
INTRAMUSCULAR | Status: AC
  Filled 2021-06-24: qty 1

## 2021-06-24 MED ORDER — OXYCODONE HCL 5 MG PO TABS
10.0000 mg | ORAL_TABLET | ORAL | Status: DC | PRN
  Filled 2021-06-24: qty 2

## 2021-06-24 MED ORDER — METHOCARBAMOL 500 MG PO TABS
500.0000 mg | ORAL_TABLET | Freq: Four times a day (QID) | ORAL | Status: DC | PRN
  Administered 2021-06-24 – 2021-06-25 (×4): 500 mg via ORAL
  Filled 2021-06-24 (×4): qty 1

## 2021-06-24 MED ORDER — VANCOMYCIN HCL 1000 MG IV SOLR
INTRAVENOUS | Status: AC
  Filled 2021-06-24: qty 20

## 2021-06-24 MED ORDER — BUPIVACAINE IN DEXTROSE 0.75-8.25 % IT SOLN
INTRATHECAL | Status: DC | PRN
  Administered 2021-06-24: 1.6 mL via INTRATHECAL

## 2021-06-24 MED ORDER — HYDROMORPHONE HCL 1 MG/ML IJ SOLN
0.5000 mg | INTRAMUSCULAR | Status: DC | PRN
  Administered 2021-06-24: 1 mg via INTRAVENOUS
  Filled 2021-06-24: qty 1

## 2021-06-24 MED ORDER — TRANEXAMIC ACID 1000 MG/10ML IV SOLN
INTRAVENOUS | Status: DC | PRN
  Administered 2021-06-24: 2000 mg via TOPICAL

## 2021-06-24 MED ORDER — POVIDONE-IODINE 10 % EX SWAB
2.0000 "application " | Freq: Once | CUTANEOUS | Status: AC
  Administered 2021-06-24: 2 via TOPICAL

## 2021-06-24 MED ORDER — IRRISEPT - 450ML BOTTLE WITH 0.05% CHG IN STERILE WATER, USP 99.95% OPTIME
TOPICAL | Status: DC | PRN
  Administered 2021-06-24: 450 mL via TOPICAL

## 2021-06-24 MED ORDER — CHLORHEXIDINE GLUCONATE 0.12 % MT SOLN
15.0000 mL | Freq: Once | OROMUCOSAL | Status: AC
  Administered 2021-06-24: 15 mL via OROMUCOSAL
  Filled 2021-06-24: qty 15

## 2021-06-24 MED ORDER — MIDAZOLAM HCL 2 MG/2ML IJ SOLN
INTRAMUSCULAR | Status: AC
  Administered 2021-06-24: 1 mg via INTRAVENOUS
  Filled 2021-06-24: qty 2

## 2021-06-24 MED ORDER — FENTANYL CITRATE (PF) 100 MCG/2ML IJ SOLN
INTRAMUSCULAR | Status: AC
  Administered 2021-06-24: 50 ug via INTRAVENOUS
  Filled 2021-06-24: qty 2

## 2021-06-24 MED ORDER — FENTANYL CITRATE (PF) 100 MCG/2ML IJ SOLN
25.0000 ug | INTRAMUSCULAR | Status: DC | PRN
  Administered 2021-06-24: 50 ug via INTRAVENOUS

## 2021-06-24 MED ORDER — KETOROLAC TROMETHAMINE 30 MG/ML IJ SOLN
15.0000 mg | Freq: Four times a day (QID) | INTRAMUSCULAR | Status: DC
  Administered 2021-06-25 (×2): 15 mg via INTRAVENOUS
  Filled 2021-06-24: qty 1

## 2021-06-24 MED ORDER — CEFAZOLIN SODIUM-DEXTROSE 2-4 GM/100ML-% IV SOLN
2.0000 g | Freq: Four times a day (QID) | INTRAVENOUS | Status: AC
  Administered 2021-06-24 – 2021-06-25 (×2): 2 g via INTRAVENOUS
  Filled 2021-06-24 (×2): qty 100

## 2021-06-24 MED ORDER — ONDANSETRON HCL 4 MG/2ML IJ SOLN: 4.0000 mg | Freq: Four times a day (QID) | INTRAMUSCULAR | Status: DC | PRN

## 2021-06-24 MED ORDER — KETOROLAC TROMETHAMINE 15 MG/ML IJ SOLN
15.0000 mg | Freq: Four times a day (QID) | INTRAMUSCULAR | Status: DC
  Administered 2021-06-24: 15 mg via INTRAVENOUS
  Filled 2021-06-24 (×2): qty 1

## 2021-06-24 MED ORDER — BUPIVACAINE-MELOXICAM ER 400-12 MG/14ML IJ SOLN
INTRAMUSCULAR | Status: DC | PRN
  Administered 2021-06-24: 400 mg

## 2021-06-24 MED ORDER — ASPIRIN 81 MG PO CHEW
81.0000 mg | CHEWABLE_TABLET | Freq: Two times a day (BID) | ORAL | Status: DC
  Administered 2021-06-24 – 2021-06-25 (×2): 81 mg via ORAL
  Filled 2021-06-24 (×2): qty 1

## 2021-06-24 MED ORDER — TRANEXAMIC ACID-NACL 1000-0.7 MG/100ML-% IV SOLN
1000.0000 mg | Freq: Once | INTRAVENOUS | Status: AC
  Administered 2021-06-24: 1000 mg via INTRAVENOUS
  Filled 2021-06-24: qty 100

## 2021-06-24 MED ORDER — FENTANYL CITRATE (PF) 100 MCG/2ML IJ SOLN
INTRAMUSCULAR | Status: AC
  Filled 2021-06-24: qty 2

## 2021-06-24 MED ORDER — OXYCODONE HCL ER 10 MG PO T12A
10.0000 mg | EXTENDED_RELEASE_TABLET | Freq: Two times a day (BID) | ORAL | Status: DC
  Administered 2021-06-24 – 2021-06-25 (×2): 10 mg via ORAL
  Filled 2021-06-24 (×2): qty 1

## 2021-06-24 MED ORDER — CEFAZOLIN SODIUM-DEXTROSE 2-4 GM/100ML-% IV SOLN
2.0000 g | INTRAVENOUS | Status: AC
  Administered 2021-06-24: 2 g via INTRAVENOUS
  Filled 2021-06-24: qty 100

## 2021-06-24 SURGICAL SUPPLY — 78 items
ALCOHOL 70% 16 OZ (MISCELLANEOUS) ×2 IMPLANT
BAG COUNTER SPONGE SURGICOUNT (BAG) IMPLANT
BAG DECANTER FOR FLEXI CONT (MISCELLANEOUS) ×2 IMPLANT
BANDAGE ESMARK 6X9 LF (GAUZE/BANDAGES/DRESSINGS) IMPLANT
BLADE SAG 18X100X1.27 (BLADE) ×2 IMPLANT
BNDG ESMARK 6X9 LF (GAUZE/BANDAGES/DRESSINGS)
BOWL SMART MIX CTS (DISPOSABLE) IMPLANT
CLSR STERI-STRIP ANTIMIC 1/2X4 (GAUZE/BANDAGES/DRESSINGS) IMPLANT
COMP FEM CR PERS SZ6 LT (Joint) ×2 IMPLANT
COMP PATELLAR 10X35 METAL (Joint) ×2 IMPLANT
COMPONENT FEM CR PERS SZ6 LT (Joint) ×1 IMPLANT
COMPONENT PATELLAR 10X35 METAL (Joint) ×1 IMPLANT
COMPONENT TIB 2 PEG SZ E LT KN (Knees) ×1 IMPLANT
COOLER ICEMAN CLASSIC (MISCELLANEOUS) ×2 IMPLANT
COVER SURGICAL LIGHT HANDLE (MISCELLANEOUS) ×2 IMPLANT
CUFF TOURN SGL QUICK 34 (TOURNIQUET CUFF) ×1
CUFF TOURN SGL QUICK 42 (TOURNIQUET CUFF) IMPLANT
CUFF TRNQT CYL 34X4.125X (TOURNIQUET CUFF) ×1 IMPLANT
DERMABOND ADVANCED (GAUZE/BANDAGES/DRESSINGS) ×1
DERMABOND ADVANCED .7 DNX12 (GAUZE/BANDAGES/DRESSINGS) ×1 IMPLANT
DRAPE EXTREMITY T 121X128X90 (DISPOSABLE) ×2 IMPLANT
DRAPE HALF SHEET 40X57 (DRAPES) ×2 IMPLANT
DRAPE INCISE IOBAN 66X45 STRL (DRAPES) ×2 IMPLANT
DRAPE ORTHO SPLIT 77X108 STRL (DRAPES) ×2
DRAPE POUCH INSTRU U-SHP 10X18 (DRAPES) ×2 IMPLANT
DRAPE SURG ORHT 6 SPLT 77X108 (DRAPES) ×2 IMPLANT
DRAPE U-SHAPE 47X51 STRL (DRAPES) ×4 IMPLANT
DRSG AQUACEL AG ADV 3.5X10 (GAUZE/BANDAGES/DRESSINGS) ×2 IMPLANT
DURAPREP 26ML APPLICATOR (WOUND CARE) ×6 IMPLANT
ELECT CAUTERY BLADE 6.4 (BLADE) ×2 IMPLANT
ELECT REM PT RETURN 9FT ADLT (ELECTROSURGICAL) ×2
ELECTRODE REM PT RTRN 9FT ADLT (ELECTROSURGICAL) ×1 IMPLANT
GLOVE SURG SYN 7.5  E (GLOVE) ×4
GLOVE SURG SYN 7.5 E (GLOVE) ×4 IMPLANT
GLOVE SURG UNDER LTX SZ7.5 (GLOVE) ×4 IMPLANT
GLOVE SURG UNDER POLY LF SZ7 (GLOVE) ×2 IMPLANT
GOWN STRL REIN XL XLG (GOWN DISPOSABLE) ×2 IMPLANT
GOWN STRL REUS W/ TWL LRG LVL3 (GOWN DISPOSABLE) ×1 IMPLANT
GOWN STRL REUS W/TWL LRG LVL3 (GOWN DISPOSABLE) ×1
HANDPIECE INTERPULSE COAX TIP (DISPOSABLE) ×1
HDLS TROCR DRIL PIN KNEE 75 (PIN) ×3
HOOD PEEL AWAY FLYTE STAYCOOL (MISCELLANEOUS) ×4 IMPLANT
INSERT FIXED AS SZ 6-7 13 LT (Insert) ×2 IMPLANT
IV NS IRRIG 3000ML ARTHROMATIC (IV SOLUTION) ×2 IMPLANT
JET LAVAGE IRRISEPT WOUND (IRRIGATION / IRRIGATOR) ×2
KIT BASIN OR (CUSTOM PROCEDURE TRAY) ×2 IMPLANT
KIT TURNOVER KIT B (KITS) ×2 IMPLANT
LAVAGE JET IRRISEPT WOUND (IRRIGATION / IRRIGATOR) ×1 IMPLANT
MANIFOLD NEPTUNE II (INSTRUMENTS) ×2 IMPLANT
MARKER SKIN DUAL TIP RULER LAB (MISCELLANEOUS) ×4 IMPLANT
NEEDLE SPNL 18GX3.5 QUINCKE PK (NEEDLE) ×2 IMPLANT
NS IRRIG 1000ML POUR BTL (IV SOLUTION) ×2 IMPLANT
PACK TOTAL JOINT (CUSTOM PROCEDURE TRAY) ×2 IMPLANT
PAD ARMBOARD 7.5X6 YLW CONV (MISCELLANEOUS) ×4 IMPLANT
PAD COLD SHLDR WRAP-ON (PAD) IMPLANT
PIN DRILL HDLS TROCAR 75 4PK (PIN) ×1 IMPLANT
SAW OSC TIP CART 19.5X105X1.3 (SAW) ×2 IMPLANT
SCREW FEMALE HEX FIX 25X2.5 (ORTHOPEDIC DISPOSABLE SUPPLIES) ×2 IMPLANT
SET HNDPC FAN SPRY TIP SCT (DISPOSABLE) ×1 IMPLANT
SPONGE T-LAP 18X18 ~~LOC~~+RFID (SPONGE) ×4 IMPLANT
STAPLER VISISTAT 35W (STAPLE) IMPLANT
STRIP CLOSURE SKIN 1/2X4 (GAUZE/BANDAGES/DRESSINGS) ×2 IMPLANT
SUCTION FRAZIER HANDLE 10FR (MISCELLANEOUS) ×1
SUCTION TUBE FRAZIER 10FR DISP (MISCELLANEOUS) ×1 IMPLANT
SUT ETHILON 2 0 FS 18 (SUTURE) IMPLANT
SUT MNCRL AB 4-0 PS2 18 (SUTURE) IMPLANT
SUT VIC AB 0 CT1 27 (SUTURE) ×2
SUT VIC AB 0 CT1 27XBRD ANBCTR (SUTURE) ×2 IMPLANT
SUT VIC AB 1 CTX 27 (SUTURE) ×6 IMPLANT
SUT VIC AB 2-0 CT1 27 (SUTURE) ×4
SUT VIC AB 2-0 CT1 TAPERPNT 27 (SUTURE) ×4 IMPLANT
SYR 50ML LL SCALE MARK (SYRINGE) ×4 IMPLANT
TIBIA POR 2 PEG SZ E L KNEE (Knees) ×2 IMPLANT
TOWEL GREEN STERILE (TOWEL DISPOSABLE) ×2 IMPLANT
TOWEL GREEN STERILE FF (TOWEL DISPOSABLE) ×2 IMPLANT
TRAY CATH 16FR W/PLASTIC CATH (SET/KITS/TRAYS/PACK) IMPLANT
UNDERPAD 30X36 HEAVY ABSORB (UNDERPADS AND DIAPERS) ×2 IMPLANT
YANKAUER SUCT BULB TIP NO VENT (SUCTIONS) ×4 IMPLANT

## 2021-06-24 NOTE — Transfer of Care (Addendum)
Immediate Anesthesia Transfer of Care Note  Patient: Malik James  Procedure(s) Performed: LEFT TOTAL KNEE ARTHROPLASTY (Left: Knee)  Patient Location: PACU  Anesthesia Type:MAC and Spinal  Level of Consciousness: drowsy and patient cooperative  Airway & Oxygen Therapy: Patient Spontanous Breathing and Patient connected to face mask oxygen  Post-op Assessment: Report given to RN and Post -op Vital signs reviewed and stable  Post vital signs: Reviewed and stable  Last Vitals:  Vitals Value Taken Time  BP 120/73   Temp    Pulse 68 06/24/21 1505  Resp 16 06/24/21 1505  SpO2 100 % 06/24/21 1505  Vitals shown include unvalidated device data.  Last Pain:  Vitals:   06/24/21 1120  PainSc: 7       Patients Stated Pain Goal: 3 (59/47/07 6151)  Complications: No notable events documented.

## 2021-06-24 NOTE — Anesthesia Procedure Notes (Signed)
Procedure Name: MAC Date/Time: 06/24/2021 12:51 PM Performed by: Michele Rockers, CRNA Pre-anesthesia Checklist: Patient identified, Emergency Drugs available, Suction available, Timeout performed and Patient being monitored Patient Re-evaluated:Patient Re-evaluated prior to induction Oxygen Delivery Method: Simple face mask

## 2021-06-24 NOTE — Discharge Instructions (Signed)

## 2021-06-24 NOTE — Anesthesia Postprocedure Evaluation (Signed)
Anesthesia Post Note  Patient: Malik James  Procedure(s) Performed: LEFT TOTAL KNEE ARTHROPLASTY (Left: Knee)     Patient location during evaluation: PACU Anesthesia Type: Regional and Spinal Level of consciousness: awake and alert Pain management: pain level controlled Vital Signs Assessment: post-procedure vital signs reviewed and stable Respiratory status: spontaneous breathing and respiratory function stable Cardiovascular status: blood pressure returned to baseline and stable Postop Assessment: spinal receding Anesthetic complications: no   No notable events documented.  Last Vitals:  Vitals:   06/24/21 1515 06/24/21 1530  BP: 108/68   Pulse: (!) 59 (!) 45  Resp: 14 18  Temp:    SpO2: 98% 96%    Last Pain:  Vitals:   06/24/21 1515  PainSc: 0-No pain                 Ericca Labra DANIEL

## 2021-06-24 NOTE — H&P (Signed)
PREOPERATIVE H&P  Chief Complaint: left knee degenerative joint disease  HPI: Malik James is a 60 y.o. male who presents for surgical treatment of left knee degenerative joint disease.  He denies any changes in medical history.  Past Medical History:  Diagnosis Date   Sleep apnea    Past Surgical History:  Procedure Laterality Date   CARDIAC CATHETERIZATION  10/20/2019   10/20/19 ([facility]): Normal LAD, LCx, RCA. 30% small-medium sized D1. Minor luminal irrgularities LCX. OM branches free of disease. LVEF > 55%. Trave MR, no gradient across AV.   TONSILLECTOMY     TRANSTHORACIC ECHOCARDIOGRAM  10/17/2019   TTE 10/17/19 ([facility]): LVEF 60-65%, mild diastolic dysfunction, mild TR/MR.   Social History   Socioeconomic History   Marital status: Single    Spouse name: Not on file   Number of children: Not on file   Years of education: Not on file   Highest education level: Not on file  Occupational History   Not on file  Tobacco Use   Smoking status: Never   Smokeless tobacco: Never  Substance and Sexual Activity   Alcohol use: Not Currently   Drug use: Not Currently   Sexual activity: Not on file  Other Topics Concern   Not on file  Social History Narrative   Not on file   Social Determinants of Health   Financial Resource Strain: Not on file  Food Insecurity: Not on file  Transportation Needs: Not on file  Physical Activity: Not on file  Stress: Not on file  Social Connections: Not on file   No family history on file. No Known Allergies Prior to Admission medications   Medication Sig Start Date End Date Taking? Authorizing Provider  aspirin EC 81 MG tablet Take 1 tablet (81 mg total) by mouth 2 (two) times daily. To be taken after surgery to prevent blood clots 06/22/21   Cristie Hem, PA-C  diclofenac (VOLTAREN) 75 MG EC tablet Take 75 mg by mouth 2 (two) times daily. 10/01/20  Yes [provider]  docusate sodium (COLACE) 100 MG capsule Take 1 capsule  (100 mg total) by mouth daily as needed. 06/22/21 06/22/22  Cristie Hem, PA-C  ibuprofen (ADVIL) 800 MG tablet Take 1 tablet (800 mg total) by mouth every 8 (eight) hours as needed. Patient taking differently: Take 800 mg by mouth every 8 (eight) hours as needed for moderate pain. 02/22/21  Yes Tarry Kos, MD  lisinopril (ZESTRIL) 20 MG tablet Take 20 mg by mouth daily. 09/04/20  Yes [provider]  methocarbamol (ROBAXIN) 500 MG tablet Take 1 tablet (500 mg total) by mouth 2 (two) times daily as needed. To be taken after surgery 06/22/21   Cristie Hem, PA-C  ondansetron (ZOFRAN) 4 MG tablet Take 1 tablet (4 mg total) by mouth every 8 (eight) hours as needed for nausea or vomiting. 06/22/21   Cristie Hem, PA-C  oxyCODONE-acetaminophen (PERCOCET) 5-325 MG tablet Take 1-2 tablets by mouth every 6 (six) hours as needed. To be taken after surgery 06/22/21   Cristie Hem, PA-C  atorvastatin (LIPITOR) 20 MG tablet Take 20 mg by mouth daily. Patient not taking: No sig reported 10/01/20   [provider]  Vitamin D, Ergocalciferol, (DRISDOL) 1.25 MG (50000 UNIT) CAPS capsule Take 50,000 Units by mouth once a week. Patient not taking: No sig reported 01/08/21   [provider]     Positive ROS: All other systems have been reviewed and  were otherwise negative with the exception of those mentioned in the HPI and as above.  Physical Exam: General: Alert, no acute distress Cardiovascular: No pedal edema Respiratory: No cyanosis, no use of accessory musculature GI: abdomen soft Skin: No lesions in the area of chief complaint Neurologic: Sensation intact distally Psychiatric: Patient is competent for consent with normal mood and affect Lymphatic: no lymphedema  MUSCULOSKELETAL: exam stable  Assessment: left knee degenerative joint disease  Plan: Plan for Procedure(s): LEFT TOTAL KNEE ARTHROPLASTY  The risks benefits and alternatives were discussed with the  patient including but not limited to the risks of nonoperative treatment, versus surgical intervention including infection, bleeding, nerve injury,  blood clots, cardiopulmonary complications, morbidity, mortality, among others, and they were willing to proceed.   Preoperative templating of the joint replacement has been completed, documented, and submitted to the Operating Room personnel in order to optimize intra-operative equipment management.   Glee Arvin, MD 06/24/2021 10:35 AM

## 2021-06-24 NOTE — Anesthesia Procedure Notes (Signed)
Spinal  Patient location during procedure: OR Start time: 06/24/2021 12:50 PM End time: 06/24/2021 1:00 PM Reason for block: surgical anesthesia Staffing Performed: anesthesiologist  Anesthesiologist: Mellody Dance, MD Preanesthetic Checklist Completed: patient identified, IV checked, risks and benefits discussed, surgical consent, monitors and equipment checked, pre-op evaluation and timeout performed Spinal Block Patient position: sitting Prep: DuraPrep Patient monitoring: cardiac monitor, continuous pulse ox and blood pressure Approach: midline Location: L3-4 Injection technique: single-shot Needle Needle type: Pencan  Needle gauge: 24 G Needle length: 9 cm Assessment Events: CSF return Additional Notes Functioning IV was confirmed and monitors were applied. Sterile prep and drape, including hand hygiene and sterile gloves were used. The patient was positioned and the spine was prepped. The skin was anesthetized with lidocaine.  Free flow of clear CSF was obtained prior to injecting local anesthetic into the CSF.  The spinal needle aspirated freely following injection.  The needle was carefully withdrawn.  The patient tolerated the procedure well.

## 2021-06-24 NOTE — Op Note (Addendum)
Total Knee Arthroplasty Procedure Note  Preoperative diagnosis: Left knee osteoarthritis  Postoperative diagnosis:same  Operative procedure: Left total knee arthroplasty. CPT [phone]  Surgeon: N. Glee Arvin, MD  Assist: Oneal Grout, PA-C; necessary for the timely completion of procedure and due to complexity of procedure.  Anesthesia: Spinal, regional  Tourniquet time: 45 minutes  Implants used: Zimmer persona pressfit Femur: CR 6 Tibia: E Patella: 35 mm Polyethylene: 13 mm, MC  Indication: Malik James is a 60 y.o. year old male with a history of knee pain. Having failed conservative management, the patient elected to proceed with a total knee arthroplasty.  We have reviewed the risk and benefits of the surgery and they elected to proceed after voicing understanding.  Procedure:  After informed consent was obtained and understanding of the risk were voiced including but not limited to bleeding, infection, damage to surrounding structures including nerves and vessels, blood clots, leg length inequality and the failure to achieve desired results, the operative extremity was marked with verbal confirmation of the patient in the holding area.   The patient was then brought to the operating room and transported to the operating room table in the supine position.  A tourniquet was applied to the operative extremity around the upper thigh. The operative limb was then prepped and draped in the usual sterile fashion and preoperative antibiotics were administered.  A time out was performed prior to the start of surgery confirming the correct extremity, preoperative antibiotic administration, as well as team members, implants and instruments available for the case. Correct surgical site was also confirmed with preoperative radiographs. The limb was then elevated for exsanguination and the tourniquet was inflated. A midline incision was made and a standard medial parapatellar approach  was performed.  The patella was prepared and sized to a 35 mm.  The bone was appropriate for pressfitting.  A cover was placed on the patella for protection from retractors.  We then turned our attention to the femur. Posterior cruciate ligament was sacrificed. Start site was drilled in the femur and the intramedullary distal femoral cutting guide was placed, set at 5 degrees valgus, taking 10 mm of distal resection. The distal cut was made. Osteophytes were then removed.  Extension gap was then checked and additional medial capsule was released to achieve balanced extension space.   Next, the proximal tibial cutting guide was placed with appropriate slope, varus/valgus alignment and depth of resection. The proximal tibial cut was made. Gap blocks were then used to assess the extension gap and alignment, and appropriate soft tissue releases were performed. Attention was turned back to the femur, which was sized using the sizing guide to a size 6. Appropriate rotation of the femoral component was determined using epicondylar axis, Whiteside's line, and assessing the flexion gap under ligament tension. The appropriate size 4-in-1 cutting block was placed and cuts were made.  Posterior femoral osteophytes and uncapped bone were then removed with the curved osteotome.  Trial components were placed, and stability was checked in full extension, mid-flexion, and deep flexion. Proper tibial rotation was determined and marked.  The patella tracked well without a lateral release. Trial components were then removed and tibial preparation performed.  The tibia was sized for a size E component.   The bony surfaces were irrigated with a pulse lavage and then dried. The stability of the construct was re-evaluated throughout a range of motion and found to be acceptable. The trial liner was removed, the knee was copiously irrigated,  and the knee was re-evaluated for any excess bone debris. The real polyethylene liner, 13 mm  thick, was inserted and checked to ensure the locking mechanism had engaged appropriately. The tourniquet was deflated and hemostasis was achieved. The wound was irrigated with normal saline.  One gram of vancomycin powder was placed in the surgical bed.  Topical 0.25% bupivacaine and meloxicam was placed in the joint.  Capsular closure was performed with a #1 vicryl, subcutaneous fat closed with a 0 vicryl suture, then subcutaneous tissue closed with interrupted 2.0 vicryl suture. The skin was then closed with a 2.0 nylon and dermabond. A sterile dressing was applied.  The patient was awakened in the operating room and taken to recovery in stable condition. All sponge, needle, and instrument counts were correct at the end of the case.  [name] was necessary for opening, closing, retracting, limb positioning and overall facilitation and completion of the surgery.  Position: supine  Complications: none.  Time Out: performed   Drains/Packing: none  Estimated blood loss: minimal  Returned to Recovery Room: in good condition.   Antibiotics: yes   Mechanical VTE (DVT) Prophylaxis: sequential compression devices, TED thigh-high  Chemical VTE (DVT) Prophylaxis: aspirin  Fluid Replacement  Crystalloid: see anesthesia record Blood: none  FFP: none   Specimens Removed: 1 to pathology   Sponge and Instrument Count Correct? yes   PACU: portable radiograph - knee AP and Lateral   Plan/RTC: Return in 2 weeks for wound check.   Weight Bearing/Load Lower Extremity: full   Implant Name Type Inv. Item Serial No. Manufacturer Lot No. LRB No. Used Action  TIBIA POR 2 PEG SZ E L KNEE - [id number] Knees TIBIA POR 2 PEG SZ E L KNEE  ZIMMER RECON(ORTH,TRAU,BIO,SG) [id number] Left 1 Implanted  COMP FEM CR PERS SZ6 LT - [id number] Joint COMP FEM CR PERS SZ6 LT  ZIMMER RECON(ORTH,TRAU,BIO,SG) [id number] Left 1 Implanted  COMP PATELLAR 10X35 METAL - [id number] Joint COMP PATELLAR 10X35 METAL  ZIMMER  RECON(ORTH,TRAU,BIO,SG) [id number] Left 1 Implanted  INSERT FIXED AS SZ 6-7 13 LT - [id number] Insert INSERT FIXED AS SZ 6-7 13 LT  ZIMMER RECON(ORTH,TRAU,BIO,SG) [id number] Left 1 Implanted    N. Glee Arvin, MD [facility] 2:21 PM

## 2021-06-24 NOTE — Anesthesia Procedure Notes (Addendum)
Anesthesia Regional Block: Adductor canal block   Pre-Anesthetic Checklist: , timeout performed,  Correct Patient, Correct Site, Correct Laterality,  Correct Procedure, Correct Position, site marked,  Risks and benefits discussed,  Surgical consent,  Pre-op evaluation,  At surgeon's request and post-op pain management  Laterality: Left  Prep: chloraprep       Needles:  Injection technique: Single-shot  Needle Type: Echogenic Stimulator Needle     Needle Length: 10cm  Needle Gauge: 20     Additional Needles:   Procedures:,,,, ultrasound used (permanent image in chart),,    Narrative:  Start time: 06/24/2021 11:50 AM End time: 06/24/2021 11:55 AM Injection made incrementally with aspirations every 5 mL.  Performed by: Personally  Anesthesiologist: Mellody Dance, MD  Additional Notes: Functioning IV was confirmed and monitors were applied.  Sterile prep and drape,hand hygiene and sterile gloves were used. Ultrasound guidance: relevant anatomy identified, needle position confirmed, local anesthetic spread visualized around nerve(s)., vascular puncture avoided. Negative aspiration and negative test dose prior to incremental administration of local anesthetic. The patient tolerated the procedure well.

## 2021-06-25 ENCOUNTER — Encounter (HOSPITAL_COMMUNITY): Payer: Self-pay | Admitting: Orthopaedic Surgery

## 2021-06-25 DIAGNOSIS — M1712 Unilateral primary osteoarthritis, left knee: Secondary | ICD-10-CM | POA: Diagnosis not present

## 2021-06-25 LAB — BASIC METABOLIC PANEL
Anion gap: 7 (ref 5–15)
BUN: 14 mg/dL (ref 6–20)
CO2: 26 mmol/L (ref 22–32)
Calcium: 8.6 mg/dL — ABNORMAL LOW (ref 8.9–10.3)
Chloride: 100 mmol/L (ref 98–111)
Creatinine, Ser: 1.03 mg/dL (ref 0.61–1.24)
GFR, Estimated: >60 mL/min (ref >60)
Glucose, Bld: 143 mg/dL — ABNORMAL HIGH (ref 70–99)
Potassium: 4 mmol/L (ref 3.5–5.1)
Sodium: 133 mmol/L — ABNORMAL LOW (ref 135–145)

## 2021-06-25 LAB — CBC
HCT: 44.3 % (ref 39.0–52.0)
Hemoglobin: 15 g/dL (ref 13.0–17.0)
MCH: 28.1 pg (ref 26.0–34.0)
MCHC: 33.9 g/dL (ref 30.0–36.0)
MCV: 83.1 fL (ref 80.0–100.0)
Platelets: 354 10*3/uL (ref 150–400)
RBC: 5.33 MIL/uL (ref 4.22–5.81)
RDW: 12.3 % (ref 11.5–15.5)
WBC: 14.9 10*3/uL — ABNORMAL HIGH (ref 4.0–10.5)
nRBC: 0 % (ref 0.0–0.2)

## 2021-06-25 MED ORDER — KETOROLAC TROMETHAMINE 30 MG/ML IJ SOLN
INTRAMUSCULAR | Status: AC
  Filled 2021-06-25: qty 1

## 2021-06-25 NOTE — Evaluation (Signed)
Occupational Therapy Evaluation Patient Details Name: Malik James MRN: [id number] DOB: June 17, 1961 Today's Date: 06/25/2021   History of Present Illness Patient is a 60 y/o male who presents s/p left TKA 06/24/21. PMH includes sleep apnea.   Clinical Impression   PTA patient independent and taking care of his son.  Admitted for above and limited by problem list below, including L knee pain, balance and activity tolerance.  He completes ADLs with up to min assist for L LE clothing/bathing mgmt, supervision for mobility in room using RW and modified independent for transfers. Educated on L knee precautions, use of ice and mobility recommendations, as well as ADL compensatory techniques.  Pt will have support from sons caregiver and his fiancee as needed.  Pt eager to get back to caring for his son, but has a caregiver to assist initially; discussed compensatory techniques to utilize once recovered.  Based on performance today, no further OT needs have been identified and OT will sign off.       Recommendations for follow up therapy are one component of a multi-disciplinary discharge planning process, led by the attending physician.  Recommendations may be updated based on patient status, additional functional criteria and insurance authorization.   Follow Up Recommendations  No OT follow up    Assistance Recommended at Discharge Intermittent Supervision/Assistance  Functional Status Assessment  Patient has had a recent decline in their functional status and demonstrates the ability to make significant improvements in function in a reasonable and predictable amount of time.  Equipment Recommendations  None recommended by OT    Recommendations for Other Services       Precautions / Restrictions Precautions Precautions: Knee Precaution Booklet Issued: No Restrictions Weight Bearing Restrictions: Yes LLE Weight Bearing: Weight bearing as tolerated      Mobility Bed Mobility Overal bed  mobility: Modified Independent                  Transfers Overall transfer level: Modified independent Equipment used: Rolling walker (2 wheels)                      Balance Overall balance assessment: Mild deficits observed, not formally tested                                         ADL either performed or assessed with clinical judgement   ADL Overall ADL's : Needs assistance/impaired     Grooming: Modified independent;Standing   Upper Body Bathing: Set up;Sitting   Lower Body Bathing: Sit to/from stand;Sitting/lateral leans;Supervison/ safety   Upper Body Dressing : Supervision/safety;Sitting   Lower Body Dressing: Minimal assistance;Sit to/from stand Lower Body Dressing Details (indicate cue type and reason): assist for L sock/shoe, will have assist as needed from caregiver/SO but plans to use slip on shoes as well Toilet Transfer: Modified Independent;Ambulation;Rolling walker (2 wheels);Regular Toilet;Grab bars   Toileting- Clothing Manipulation and Hygiene: Modified independent;Sit to/from stand   Tub/ Shower Transfer: Supervision/safety;Ambulation;Shower seat;Rolling walker (2 wheels) Tub/Shower Transfer Details (indicate cue type and reason): simulated, educated on technique; plans to use walk in shower initally then progress to tub shower Functional mobility during ADLs: Supervision/safety;Rolling walker (2 wheels)       Vision         Perception     Praxis      Pertinent Vitals/Pain Pain Assessment: 0-10 Pain Score: 4  Pain  Location: L knee Pain Descriptors / Indicators: Discomfort;Operative site guarding Pain Intervention(s): Limited activity within patient's tolerance;Monitored during session;Repositioned     Hand Dominance     Extremity/Trunk Assessment Upper Extremity Assessment Upper Extremity Assessment: Overall WFL for tasks assessed   Lower Extremity Assessment Lower Extremity Assessment: Defer to PT  evaluation (s/p L TKA)   Cervical / Trunk Assessment Cervical / Trunk Assessment: Normal   Communication Communication Communication: No difficulties   Cognition Arousal/Alertness: Awake/alert Behavior During Therapy: WFL for tasks assessed/performed Overall Cognitive Status: Within Functional Limits for tasks assessed                                       General Comments  reviewed L knee precautions, use of ice and mobility recommendations    Exercises     Shoulder Instructions      Home Living Family/patient expects to be discharged to:: Private residence Living Arrangements: Spouse/significant other;Children Available Help at Discharge: Available 24 hours/day Type of Home: House Home Access: Ramped entrance     Home Layout: One level     Bathroom Shower/Tub: Tub/shower unit;Walk-in shower   Bathroom Toilet: Standard Bathroom Accessibility: Yes   Home Equipment: Agricultural consultant (2 wheels);Shower seat;Grab bars - tub/shower;Grab bars - toilet   Additional Comments: pts son is disabled      Prior Functioning/Environment Prior Level of Function : Independent/Modified Independent               ADLs Comments: pt cares for his disabled son, he will have support from sons caregiver as well        OT Problem List: Decreased activity tolerance;Impaired balance (sitting and/or standing);Pain      OT Treatment/Interventions:      OT Goals(Current goals can be found in the care plan section) Acute Rehab OT Goals Patient Stated Goal: to get back to taking care of my son OT Goal Formulation: With patient  OT Frequency:     Barriers to D/C:            Co-evaluation              AM-PAC OT "6 Clicks" Daily Activity     Outcome Measure Help from another person eating meals?: None Help from another person taking care of personal grooming?: A Little Help from another person toileting, which includes using toliet, bedpan, or urinal?: A  Little Help from another person bathing (including washing, rinsing, drying)?: A Little Help from another person to put on and taking off regular upper body clothing?: A Little Help from another person to put on and taking off regular lower body clothing?: A Little 6 Click Score: 19   End of Session Equipment Utilized During Treatment: Rolling walker (2 wheels) Nurse Communication: Mobility status  Activity Tolerance: Patient tolerated treatment well Patient left: in chair;with call bell/phone within reach  OT Visit Diagnosis: Other abnormalities of gait and mobility (R26.89);Pain Pain - Right/Left: Left Pain - part of body: Knee                Time: 8466-5993 OT Time Calculation (min): 17 min Charges:  OT General Charges $OT Visit: 1 Visit OT Evaluation $OT Eval Low Complexity: 1 Low  Barry Brunner, OT Acute Rehabilitation Services Pager [phone] Office [phone]   Chancy Milroy 06/25/2021, 8:57 AM

## 2021-06-25 NOTE — Discharge Summary (Signed)
Patient ID: Malik James MRN: [id number] DOB/AGE: April 03, 1961 60 y.o.  Admit date: 06/24/2021 Discharge date: 06/25/2021  Admission Diagnoses:  Primary osteoarthritis of left knee  Discharge Diagnoses:  Principal Problem:   Primary osteoarthritis of left knee Active Problems:   Status post total left knee replacement   Past Medical History:  Diagnosis Date   Sleep apnea     Surgeries: Procedure(s): LEFT TOTAL KNEE ARTHROPLASTY on 06/24/2021   Consultants (if any):   Discharged Condition: Improved  Hospital Course: Malik James is an 60 y.o. male who was admitted 06/24/2021 with a diagnosis of Primary osteoarthritis of left knee and went to the operating room on 06/24/2021 and underwent the above named procedures.    He was given perioperative antibiotics:  Anti-infectives (From admission, onward)    Start     Dose/Rate Route Frequency Ordered Stop   06/24/21 1900  ceFAZolin (ANCEF) IVPB 2g/100 mL premix        2 g 200 mL/hr over 30 Minutes Intravenous Every 6 hours 06/24/21 1633 06/25/21 0051   06/24/21 1335  vancomycin (VANCOCIN) powder  Status:  Discontinued          As needed 06/24/21 1335 06/24/21 1458   06/24/21 1045  ceFAZolin (ANCEF) IVPB 2g/100 mL premix        2 g 200 mL/hr over 30 Minutes Intravenous On call to O.R. 06/24/21 1036 06/24/21 1325     .  He was given sequential compression devices, early ambulation, and appropriate chemoprophylaxis for DVT prophylaxis.  He benefited maximally from the hospital stay and there were no complications.    Recent vital signs:  Vitals:   06/25/21 0430 06/25/21 0716  BP: (!) 145/87 106/68  Pulse: 63 75  Resp: 20 18  Temp: 98 F (36.7 C) 98.1 F (36.7 C)  SpO2: 97% 93%    Recent laboratory studies:  Lab Results  Component Value Date   HGB 15.0 06/25/2021   HGB 16.4 06/24/2021   HGB 16.3 06/21/2021   Lab Results  Component Value Date   WBC 14.9 (H) 06/25/2021   PLT 354 06/25/2021   Lab Results   Component Value Date   INR 1.0 06/24/2021   Lab Results  Component Value Date   NA 133 (L) 06/25/2021   K 4.0 06/25/2021   CL 100 06/25/2021   CO2 26 06/25/2021   BUN 14 06/25/2021   CREATININE 1.03 06/25/2021   GLUCOSE 143 (H) 06/25/2021    Discharge Medications:   Allergies as of 06/25/2021   No Known Allergies      Medication List     STOP taking these medications    diclofenac 75 MG EC tablet Commonly known as: VOLTAREN   ibuprofen 800 MG tablet Commonly known as: ADVIL   Vitamin D (Ergocalciferol) 1.25 MG (50000 UNIT) Caps capsule Commonly known as: DRISDOL       TAKE these medications    aspirin EC 81 MG tablet Take 1 tablet (81 mg total) by mouth 2 (two) times daily. To be taken after surgery to prevent blood clots   atorvastatin 20 MG tablet Commonly known as: LIPITOR Take 20 mg by mouth daily.   docusate sodium 100 MG capsule Commonly known as: Colace Take 1 capsule (100 mg total) by mouth daily as needed.   lisinopril 20 MG tablet Commonly known as: ZESTRIL Take 20 mg by mouth daily.   methocarbamol 500 MG tablet Commonly known as: Robaxin Take 1 tablet (500 mg total) by mouth  2 (two) times daily as needed. To be taken after surgery   ondansetron 4 MG tablet Commonly known as: Zofran Take 1 tablet (4 mg total) by mouth every 8 (eight) hours as needed for nausea or vomiting.   oxyCODONE-acetaminophen 5-325 MG tablet Commonly known as: Percocet Take 1-2 tablets by mouth every 6 (six) hours as needed. To be taken after surgery               Durable Medical Equipment  (From admission, onward)           Start     Ordered   06/24/21 1634  DME Walker rolling  Once       Question Answer Comment  Walker: With 5 Inch Wheels   Patient needs a walker to treat with the following condition Status post left partial knee replacement      06/24/21 1633   06/24/21 1634  DME 3 n 1  Once        06/24/21 1633   06/24/21 1634  DME  Bedside commode  Once       Question:  Patient needs a bedside commode to treat with the following condition  Answer:  Status post left partial knee replacement   06/24/21 1633            Diagnostic Studies: DG Knee Left Port  Result Date: 06/24/2021 CLINICAL DATA:  Post op EXAM: PORTABLE LEFT KNEE - 1-2 VIEW COMPARISON:  None. FINDINGS: Postop total knee arthroplasty. Prosthetic components appear well seated. Expected postsurgical change in the soft tissues. IMPRESSION: No complication following total knee arthroplasty. Electronically Signed   By: Genevive Bi M.D.   On: 06/24/2021 15:34    Disposition: Discharge disposition: 01-Home or Self Care       Discharge Instructions     Call MD / Call 911   Complete by: As directed    If you experience chest pain or shortness of breath, CALL 911 and be transported to the hospital emergency room.  If you develope a fever above 101.5 F, pus (white drainage) or increased drainage or redness at the wound, or calf pain, call your surgeon's office.   Constipation Prevention   Complete by: As directed    Drink plenty of fluids.  Prune juice may be helpful.  You may use a stool softener, such as Colace (over the counter) 100 mg twice a day.  Use MiraLax (over the counter) for constipation as needed.   Driving restrictions   Complete by: As directed    No driving while taking narcotic pain meds.   Increase activity slowly as tolerated   Complete by: As directed    Post-operative opioid taper instructions:   Complete by: As directed    POST-OPERATIVE OPIOID TAPER INSTRUCTIONS: It is important to wean off of your opioid medication as soon as possible. If you do not need pain medication after your surgery it is ok to stop day one. Opioids include: Codeine, Hydrocodone(Norco, Vicodin), Oxycodone(Percocet, oxycontin) and hydromorphone amongst others.  Long term and even short term use of opiods can cause: Increased pain  response Dependence Constipation Depression Respiratory depression And more.  Withdrawal symptoms can include Flu like symptoms Nausea, vomiting And more Techniques to manage these symptoms Hydrate well Eat regular healthy meals Stay active Use relaxation techniques(deep breathing, meditating, yoga) Do Not substitute Alcohol to help with tapering If you have been on opioids for less than two weeks and do not have pain than it is ok to  stop all together.  Plan to wean off of opioids This plan should start within one week post op of your joint replacement. Maintain the same interval or time between taking each dose and first decrease the dose.  Cut the total daily intake of opioids by one tablet each day Next start to increase the time between doses. The last dose that should be eliminated is the evening dose.           Follow-up Information     Tarry Kos, MD. Schedule an appointment as soon as possible for a visit in 2 week(s).   Specialty: Orthopedic Surgery Contact information: [address] [place] [address] [phone]                  Signed: Glee Arvin 06/25/2021, 9:08 AM

## 2021-06-25 NOTE — TOC Transition Note (Signed)
Transition of Care [facility]) - CM/SW Discharge Note   Patient Details  Name: Malik James MRN: [id number] Date of Birth: 1961/03/30  [facility] CM/SW Contact:  Lawerance Sabal, RN Phone Number: 06/25/2021, 8:16 AM   Clinical Narrative:    Patient preassigned to [facility] by office prior to procedure. Unit staff to provide any DME needed from unit supply.     Final next level of care: Home w Home Health Services     Patient Goals and CMS Choice        Discharge Placement                       Discharge Plan and Services                            [facility] Agency: [facility] Date [facility] Agency Contacted: 06/25/21 Time HH Agency Contacted: [phone] Representative spoke with at [facility] Agency: Stacie  Social Determinants of Health (SDOH) Interventions     Readmission Risk Interventions No flowsheet data found.

## 2021-06-25 NOTE — Progress Notes (Signed)
Physical Therapy Treatment Patient Details Name: Malik James MRN: [id number] DOB: Dec 24, 1960 Today's Date: 06/25/2021   History of Present Illness Patient is a 60 y/o male who presents s/p left TKA 06/24/21. PMH includes sleep apnea.    PT Comments    Patient reports feeling sore from all the activity this morning. This session focused on stair training and reviewing the rest of the HEP handout. Pt functioning at supervision-Mod I level for all mobility and eager to return home. Knee extension AROM looks improved from this morning as pt had knee positioned in extension for most of morning. Would likely benefit from HHPT vs OPPT if MD deems appropriate. Will follow.     Recommendations for follow up therapy are one component of a multi-disciplinary discharge planning process, led by the attending physician.  Recommendations may be updated based on patient status, additional functional criteria and insurance authorization.  Follow Up Recommendations  Follow physician's recommendations for discharge plan and follow up therapies     Assistance Recommended at Discharge PRN  Equipment Recommendations  None recommended by PT    Recommendations for Other Services       Precautions / Restrictions Precautions Precautions: Knee Precaution Booklet Issued: No Precaution Comments: Reviewed no pillow under knee and precautions Restrictions Weight Bearing Restrictions: Yes LLE Weight Bearing: Weight bearing as tolerated     Mobility  Bed Mobility Overal bed mobility: Modified Independent             General bed mobility comments: Sitting in chair upon PT arrival.    Transfers Overall transfer level: Modified independent Equipment used: Rolling walker (2 wheels) Transfers: Sit to/from Stand Sit to Stand: Modified independent (Device/Increase time)           General transfer comment: Stood from chair without difficulty, good hand placement demo     Ambulation/Gait Ambulation/Gait assistance: Modified independent (Device/Increase time) Gait Distance (Feet): 50 Feet Assistive device: Rolling walker (2 wheels) Gait Pattern/deviations: Step-through pattern;Decreased stride length;Decreased stance time - left;Knee flexed in stance - left   Gait velocity interpretation: 1.31 - 2.62 ft/sec, indicative of limited community ambulator   General Gait Details: Slow, steady gait with cues for knee extension during stance phase.   Stairs Stairs: Yes Stairs assistance: Supervision Stair Management: One rail Right;Step to pattern Number of Stairs: 13 General stair comments: Cues for technique/safety.   Wheelchair Mobility    Modified Rankin (Stroke Patients Only)       Balance Overall balance assessment: Mild deficits observed, not formally tested                                          Cognition Arousal/Alertness: Awake/alert Behavior During Therapy: WFL for tasks assessed/performed Overall Cognitive Status: Within Functional Limits for tasks assessed                                          Exercises Total Joint Exercises Ankle Circles/Pumps: AROM;Both;10 reps;Seated Quad Sets: AROM;Both;5 reps;Seated (3-5 sec hold) Towel Squeeze: Both;10 reps;Seated;Strengthening Heel Slides: AROM;Left;Seated (x3 30 sec hold) Hip ABduction/ADduction: AROM;Left;10 reps;Seated Straight Leg Raises: AROM;Left;5 reps;Seated Long Arc Quad: AROM;Left;10 reps;Seated Goniometric ROM: Grossly 10-75 degrees knee AROM    General Comments General comments (skin integrity, edema, etc.): reviewed L knee precautions, use of ice and mobility  recommendations      Pertinent Vitals/Pain Pain Assessment: Faces Pain Score: 5  Faces Pain Scale: Hurts even more Pain Location: L knee Pain Descriptors / Indicators: Discomfort;Operative site guarding;Sore Pain Intervention(s): Monitored during  session;Repositioned;Limited activity within patient's tolerance    Home Living Family/patient expects to be discharged to:: Private residence Living Arrangements: Spouse/significant other;Children Available Help at Discharge: Available 24 hours/day Type of Home: House Home Access: Ramped entrance       Home Layout: One level;Laundry or work area in [employer] Equipment: Agricultural consultant (2 wheels);Shower seat;Grab bars - tub/shower;Grab bars - toilet Additional Comments: pts son is disabled    Prior Function            PT Goals (current goals can now be found in the care plan section) Acute Rehab PT Goals Patient Stated Goal: to get back for caring for my son PT Goal Formulation: With patient Time For Goal Achievement: 07/09/21 Potential to Achieve Goals: Good Progress towards PT goals: Progressing toward goals    Frequency    7X/week      PT Plan Current plan remains appropriate    Co-evaluation              AM-PAC PT "6 Clicks" Mobility   Outcome Measure  Help needed turning from your back to your side while in a flat bed without using bedrails?: None Help needed moving from lying on your back to sitting on the side of a flat bed without using bedrails?: None Help needed moving to and from a bed to a chair (including a wheelchair)?: None Help needed standing up from a chair using your arms (e.g., wheelchair or bedside chair)?: None Help needed to walk in hospital room?: A Little Help needed climbing 3-5 steps with a railing? : A Little 6 Click Score: 22    End of Session   Activity Tolerance: Patient tolerated treatment well Patient left: in chair;with call bell/phone within reach Nurse Communication: Mobility status PT Visit Diagnosis: Pain;Difficulty in walking, not elsewhere classified (R26.2) Pain - Right/Left: Left Pain - part of body: Knee     Time: 1610-9604 PT Time Calculation (min) (ACUTE ONLY): 12 min  Charges:  $Gait Training: 8-22  mins                     Vale Haven, PT, DPT Acute Rehabilitation Services Pager [phone] Office [phone]      [name] 06/25/2021, 11:41 AM

## 2021-06-25 NOTE — Evaluation (Signed)
Physical Therapy Evaluation Patient Details Name: Malik James MRN: [id number] DOB: 1961-03-29 Today's Date: 06/25/2021  History of Present Illness  Patient is a 60 y/o male who presents s/p left TKA 06/24/21. PMH includes sleep apnea.  Clinical Impression  Patient presents with pain, decreased AROM and post surgical deficits s/p above surgery. Pt independent PTA and helps care for his disabled son at home. Will have a caregiver to assist with son and himself as needed. Today, pt tolerated transfers and gait training with supervision-Mod I for safety and use of RW. Provided HEP handout and reviewed exercises. Knee AROM ~10-75 degrees per visual observation. Education re: knee precautions, positioning, elevation, exercises etc. Will plan for stair training next session and review rest of handout prior to d/c. Will follow acutely to maximize independence and mobility prior to return home. Pt will be safe to d/c home after next session.     Recommendations for follow up therapy are one component of a multi-disciplinary discharge planning process, led by the attending physician.  Recommendations may be updated based on patient status, additional functional criteria and insurance authorization.  Follow Up Recommendations Follow physician's recommendations for discharge plan and follow up therapies    Assistance Recommended at Discharge PRN  Functional Status Assessment Patient has had a recent decline in their functional status and demonstrates the ability to make significant improvements in function in a reasonable and predictable amount of time.  Equipment Recommendations  None recommended by PT    Recommendations for Other Services       Precautions / Restrictions Precautions Precautions: Knee Precaution Booklet Issued: No Precaution Comments: Reviewed no pillow under knee and precautions Restrictions Weight Bearing Restrictions: Yes LLE Weight Bearing: Weight bearing as tolerated       Mobility  Bed Mobility Overal bed mobility: Modified Independent             General bed mobility comments: Sitting in chair upon PT arrival.    Transfers Overall transfer level: Modified independent Equipment used: Rolling walker (2 wheels)               General transfer comment: Stood from chair without difficulty, good hand placement demo    Ambulation/Gait Ambulation/Gait assistance: Modified independent (Device/Increase time) Gait Distance (Feet): 100 Feet Assistive device: Rolling walker (2 wheels) Gait Pattern/deviations: Step-through pattern;Decreased stride length;Decreased stance time - left;Knee flexed in stance - left   Gait velocity interpretation: 1.31 - 2.62 ft/sec, indicative of limited community ambulator   General Gait Details: Slow, steady gait with cues for knee extension during stance phase andknee flexion during swing; heavy reliance on UEs.  Stairs            Wheelchair Mobility    Modified Rankin (Stroke Patients Only)       Balance Overall balance assessment: Mild deficits observed, not formally tested                                           Pertinent Vitals/Pain Pain Assessment: 0-10 Pain Score: 5  Pain Location: L knee Pain Descriptors / Indicators: Discomfort;Operative site guarding;Sore Pain Intervention(s): Monitored during session;Repositioned    Home Living Family/patient expects to be discharged to:: Private residence Living Arrangements: Spouse/significant other;Children Available Help at Discharge: Available 24 hours/day Type of Home: House Home Access: Ramped entrance       Home Layout: One level;Laundry or work area in  basement Home Equipment: Rolling Walker (2 wheels);Shower seat;Grab bars - tub/shower;Grab bars - toilet Additional Comments: pts son is disabled    Prior Function Prior Level of Function : Independent/Modified Independent               ADLs Comments: pt cares  for his disabled son, he will have support from sons caregiver as well     Hand Dominance        Extremity/Trunk Assessment   Upper Extremity Assessment Upper Extremity Assessment: Defer to OT evaluation    Lower Extremity Assessment Lower Extremity Assessment: LLE deficits/detail LLE Deficits / Details: Able to perform QS and LAQ, sensation WFls. LLE Sensation: WNL    Cervical / Trunk Assessment Cervical / Trunk Assessment: Normal  Communication   Communication: No difficulties  Cognition Arousal/Alertness: Awake/alert Behavior During Therapy: WFL for tasks assessed/performed Overall Cognitive Status: Within Functional Limits for tasks assessed                                          General Comments General comments (skin integrity, edema, etc.): reviewed L knee precautions, use of ice and mobility recommendations    Exercises Total Joint Exercises Ankle Circles/Pumps: AROM;Both;10 reps;Seated Quad Sets: AROM;Both;5 reps;Seated (3-5 sec hold) Heel Slides: AROM;Left;Seated (x3 30 sec hold) Long Arc Quad: AROM;Left;10 reps;Seated Goniometric ROM: Grossly 10-75 degrees knee AROM   Assessment/Plan    PT Assessment Patient needs continued PT services  PT Problem List Decreased balance;Pain;Decreased skin integrity;Decreased knowledge of precautions;Decreased range of motion       PT Treatment Interventions Gait training;Stair training;Balance training;Patient/family education;Therapeutic exercise;Therapeutic activities    PT Goals (Current goals can be found in the Care Plan section)  Acute Rehab PT Goals Patient Stated Goal: to get back for caring for my son PT Goal Formulation: With patient Time For Goal Achievement: 07/09/21 Potential to Achieve Goals: Good    Frequency 7X/week   Barriers to discharge        Co-evaluation               AM-PAC PT "6 Clicks" Mobility  Outcome Measure Help needed turning from your back to your  side while in a flat bed without using bedrails?: None Help needed moving from lying on your back to sitting on the side of a flat bed without using bedrails?: None Help needed moving to and from a bed to a chair (including a wheelchair)?: A Little Help needed standing up from a chair using your arms (e.g., wheelchair or bedside chair)?: A Little Help needed to walk in hospital room?: A Little Help needed climbing 3-5 steps with a railing? : A Little 6 Click Score: 20    End of Session   Activity Tolerance: Patient tolerated treatment well Patient left: in chair;with call bell/phone within reach Nurse Communication: Mobility status PT Visit Diagnosis: Pain;Difficulty in walking, not elsewhere classified (R26.2) Pain - Right/Left: Left Pain - part of body: Knee    Time: 0175-1025 PT Time Calculation (min) (ACUTE ONLY): 20 min   Charges:   PT Evaluation $PT Eval Low Complexity: 1 Low          Vale Haven, PT, DPT Acute Rehabilitation Services Pager [phone] Office [phone]     Blake Divine A Quisha Mabie 06/25/2021, 9:07 AM

## 2021-06-25 NOTE — Progress Notes (Signed)
Patient is discharged from room 3C11 at this time. Alert and in stable condition. IV site d/c'd and instructions read to patient with understanding verbalized and all questions answered. Left unit via wheelchair with all belongings at side. 

## 2021-06-25 NOTE — Progress Notes (Signed)
   Subjective:  Patient reports pain as mild.  Walked well with PT  Objective:   VITALS:   Vitals:   06/24/21 2023 06/24/21 2240 06/25/21 0430 06/25/21 0716  BP: 127/74 117/67 (!) 145/87 106/68  Pulse: 69 66 63 75  Resp: 18 18 20 18   Temp: 98.2 F (36.8 C) 98 F (36.7 C) 98 F (36.7 C) 98.1 F (36.7 C)  TempSrc: Oral Oral Oral Oral  SpO2: 95% 99% 97% 93%  Weight:      Height:        Neurovascular intact Sensation intact distally Intact pulses distally Dorsiflexion/Plantar flexion intact   Lab Results  Component Value Date   WBC 14.9 (H) 06/25/2021   HGB 15.0 06/25/2021   HCT 44.3 06/25/2021   MCV 83.1 06/25/2021   PLT 354 06/25/2021     Assessment/Plan:  1 Day Post-Op   - Expected postop acute blood loss anemia - will monitor for symptoms - Up with PT/OT - DVT ppx - SCDs, ambulation, aspirin - WBAT operative extremity - Pain control - Discharge planning - home today after stair training with PT  13/07/2021 06/25/2021, 9:09 AM [phone]

## 2021-06-28 ENCOUNTER — Encounter (HOSPITAL_COMMUNITY): Payer: Self-pay | Admitting: Orthopaedic Surgery

## 2021-06-29 ENCOUNTER — Telehealth: Payer: Self-pay | Admitting: Orthopaedic Surgery

## 2021-06-29 NOTE — Telephone Encounter (Signed)
Gerald from [facility] called about pt and pt is reporting 10/10 pain where knee was replaced. He told pt that he could use ice and keep taking medication.   CB [phone]

## 2021-06-30 MED ORDER — KETOROLAC TROMETHAMINE 10 MG PO TABS: 10.0000 mg | ORAL_TABLET | Freq: Two times a day (BID) | ORAL | 0 refills | Status: AC | PRN

## 2021-06-30 NOTE — Telephone Encounter (Signed)
I sent toradol.  Tell him to take it easy and keep ice on it.

## 2021-06-30 NOTE — Addendum Note (Signed)
Addended by: Mayra Reel on: 06/30/2021 07:47 AM   Modules accepted: Orders

## 2021-06-30 NOTE — Telephone Encounter (Signed)
Patient aware.

## 2021-07-06 ENCOUNTER — Telehealth: Payer: Self-pay | Admitting: Physician Assistant

## 2021-07-06 ENCOUNTER — Ambulatory Visit (INDEPENDENT_AMBULATORY_CARE_PROVIDER_SITE_OTHER): Payer: BC Managed Care – PPO | Admitting: Physician Assistant

## 2021-07-06 ENCOUNTER — Other Ambulatory Visit: Payer: Self-pay

## 2021-07-06 ENCOUNTER — Encounter: Payer: Self-pay | Admitting: Orthopaedic Surgery

## 2021-07-06 DIAGNOSIS — Z96652 Presence of left artificial knee joint: Secondary | ICD-10-CM

## 2021-07-06 MED ORDER — OXYCODONE-ACETAMINOPHEN 5-325 MG PO TABS: 1.0000 | ORAL_TABLET | Freq: Three times a day (TID) | ORAL | 0 refills | Status: DC | PRN

## 2021-07-06 MED ORDER — METHOCARBAMOL 500 MG PO TABS: 500.0000 mg | ORAL_TABLET | Freq: Two times a day (BID) | ORAL | 2 refills | Status: AC | PRN

## 2021-07-06 NOTE — Telephone Encounter (Signed)
No driving for 4 weeks after surgery

## 2021-07-06 NOTE — Telephone Encounter (Signed)
Pt called requesting a call back. Pt asking if he is allowed to drive. Pt phone number is [phone].

## 2021-07-06 NOTE — Progress Notes (Signed)
   Post-Op Visit Note   Patient: Malik James           Date of Birth: 1961-02-08           MRN: [id number] Visit Date: 07/06/2021 PCP: Pcp, No   Assessment & Plan:  Chief Complaint:  Chief Complaint  Patient presents with   Left Knee - Routine Post Op   Visit Diagnoses:  1. S/P TKR (total knee replacement), left     Plan: Patient is a pleasant 60 year old gentleman who comes in today 2 weeks status post left total knee replacement 06/24/2021.  He has been doing well.  He has been getting home health physical therapy and is ambulating unassisted.  Overall, doing well.  Examination of the left knee reveals a well-healing surgical incision with nylon sutures in place.  No evidence of infection or cellulitis.  Calf is soft and nontender.  He does have mild swelling to the left lower extremity.  Today, sutures removed and Steri-Strips applied.  I recommended that he continue to use his compression sock another week or until the swelling in the left leg is subsided.  Of gone ahead and made a referral to outpatient physical therapy.  Dental prophylaxis reinforced.  Follow-up with [place] in 4 weeks time for repeat evaluation 2 view x-rays of the left knee.  Call with concerns or questions.  Follow-Up Instructions: Return in about 4 weeks (around 08/03/2021).   Orders:  No orders of the defined types were placed in this encounter.  No orders of the defined types were placed in this encounter.   Imaging: No new imaging  PMFS History: Patient Active Problem List   Diagnosis Date Noted   Status post total left knee replacement 06/24/2021   Primary osteoarthritis of left knee 11/25/2020   Past Medical History:  Diagnosis Date   Sleep apnea     History reviewed. No pertinent family history.  Past Surgical History:  Procedure Laterality Date   CARDIAC CATHETERIZATION  10/20/2019   10/20/19 ([facility]): Normal LAD, LCx, RCA. 30% small-medium sized D1. Minor luminal irrgularities LCX. OM branches  free of disease. LVEF > 55%. Trave MR, no gradient across AV.   TONSILLECTOMY     TOTAL KNEE ARTHROPLASTY Left 06/24/2021   Procedure: LEFT TOTAL KNEE ARTHROPLASTY;  Surgeon: Tarry Kos, MD;  Location: [facility] OR;  Service: Orthopedics;  Laterality: Left;   TRANSTHORACIC ECHOCARDIOGRAM  10/17/2019   TTE 10/17/19 ([facility]): LVEF 60-65%, mild diastolic dysfunction, mild TR/MR.   Social History   Occupational History   Not on file  Tobacco Use   Smoking status: Never   Smokeless tobacco: Never  Substance and Sexual Activity   Alcohol use: Not Currently   Drug use: Not Currently   Sexual activity: Not on file

## 2021-07-11 NOTE — Telephone Encounter (Signed)
I called patient and advised. 

## 2021-07-13 ENCOUNTER — Telehealth: Payer: Self-pay

## 2021-07-13 NOTE — Telephone Encounter (Signed)
Pt called and would like a refill on oxycodone  

## 2021-07-14 ENCOUNTER — Other Ambulatory Visit: Payer: Self-pay | Admitting: Physician Assistant

## 2021-07-14 MED ORDER — OXYCODONE-ACETAMINOPHEN 5-325 MG PO TABS
1.0000 | ORAL_TABLET | Freq: Three times a day (TID) | ORAL | 0 refills | Status: AC | PRN
Start: 2021-07-14 — End: ?

## 2021-07-14 NOTE — Telephone Encounter (Signed)
sent 

## 2021-07-20 ENCOUNTER — Telehealth: Payer: Self-pay | Admitting: Orthopedic Surgery

## 2021-07-20 ENCOUNTER — Other Ambulatory Visit: Payer: Self-pay

## 2021-07-20 ENCOUNTER — Telehealth: Payer: Self-pay | Admitting: Orthopaedic Surgery

## 2021-07-20 NOTE — Telephone Encounter (Signed)
Faxed to [phone].

## 2021-07-20 NOTE — Telephone Encounter (Signed)
Patient called advised he spoke to [facility] with [facility] and  was advised they need an order for outpatient (PT) faxed to them.  The number to contact patient is [phone]  # to [facility]

## 2021-07-20 NOTE — Telephone Encounter (Signed)
Pt called stating he had surgery 06/24/21 and he finished his home PT today so he would like a referral for outpt PT to be sent and he would like to start right away. He would like a CB to update when and who will be coming.   [phone]

## 2021-07-20 NOTE — Progress Notes (Signed)
Pt referral already made 07/06/21.

## 2021-07-20 NOTE — Telephone Encounter (Signed)
Called patient. Referral already made since 07/06/2021. Advised patient to call [facility] to schedule appt. Gave him address and phone number.

## 2021-08-02 ENCOUNTER — Other Ambulatory Visit: Payer: Self-pay

## 2021-08-02 ENCOUNTER — Ambulatory Visit (INDEPENDENT_AMBULATORY_CARE_PROVIDER_SITE_OTHER): Payer: BC Managed Care – PPO

## 2021-08-02 ENCOUNTER — Ambulatory Visit (INDEPENDENT_AMBULATORY_CARE_PROVIDER_SITE_OTHER): Payer: BC Managed Care – PPO | Admitting: Orthopaedic Surgery

## 2021-08-02 DIAGNOSIS — Z96652 Presence of left artificial knee joint: Secondary | ICD-10-CM

## 2021-08-03 ENCOUNTER — Ambulatory Visit: Payer: BC Managed Care – PPO | Admitting: Orthopaedic Surgery

## 2021-08-04 NOTE — Progress Notes (Signed)
° °  Post-Op Visit Note   Patient: Malik James           Date of Birth: 04-Feb-1961           MRN: [id number] Visit Date: 08/02/2021 PCP: Pcp, No   Assessment & Plan:  Chief Complaint:  Chief Complaint  Patient presents with   Left Knee - Follow-up   Visit Diagnoses:  1. S/P TKR (total knee replacement), left     Plan: Malik James is 6 weeks status post left total knee replacement.  He just reports that its a little sore but he feels like it is getting there.  He has no complaints otherwise.  He is doing physical therapy at [facility] in [place].  Left knee shows a healed surgical scar.  Range of motion 5 to 105 degrees.  Stable to varus valgus.  X-rays show stable total knee replacement without any complications.  Malik James is doing very well at the 6-week mark.  He will continue to do outpatient PT at [facility].  Dental prophylaxis reinforced.  Increase activity as tolerated.  Recheck in 6 weeks.  Follow-Up Instructions: Return in about 6 weeks (around 09/13/2021).   Orders:  Orders Placed This Encounter  Procedures   XR Knee 1-2 Views Left   No orders of the defined types were placed in this encounter.   Imaging: No results found.  PMFS History: Patient Active Problem List   Diagnosis Date Noted   Status post total left knee replacement 06/24/2021   Primary osteoarthritis of left knee 11/25/2020   Past Medical History:  Diagnosis Date   Sleep apnea     No family history on file.  Past Surgical History:  Procedure Laterality Date   CARDIAC CATHETERIZATION  10/20/2019   10/20/19 ([facility]): Normal LAD, LCx, RCA. 30% small-medium sized D1. Minor luminal irrgularities LCX. OM branches free of disease. LVEF > 55%. Trave MR, no gradient across AV.   TONSILLECTOMY     TOTAL KNEE ARTHROPLASTY Left 06/24/2021   Procedure: LEFT TOTAL KNEE ARTHROPLASTY;  Surgeon: Tarry Kos, MD;  Location: [facility] OR;  Service: Orthopedics;  Laterality: Left;   TRANSTHORACIC ECHOCARDIOGRAM  10/17/2019    TTE 10/17/19 ([facility]): LVEF 60-65%, mild diastolic dysfunction, mild TR/MR.   Social History   Occupational History   Not on file  Tobacco Use   Smoking status: Never   Smokeless tobacco: Never  Substance and Sexual Activity   Alcohol use: Not Currently   Drug use: Not Currently   Sexual activity: Not on file

## 2021-08-29 ENCOUNTER — Telehealth: Payer: Self-pay | Admitting: Orthopaedic Surgery

## 2021-08-29 MED ORDER — AMOXICILLIN 500 MG PO TABS: ORAL_TABLET | ORAL | 2 refills | Status: DC

## 2021-08-29 NOTE — Telephone Encounter (Signed)
That's ok to do.  Thanks.

## 2021-08-29 NOTE — Telephone Encounter (Signed)
Patient has appt for dental cleaning on Feb 9 and states that this will be two days shy of him being 3 months post op. He wants to be sure that this will be ok, as his dentist told him many doctors will not approve until 3 months post op. He will also need pre-dental antibiotics sent to [facility] in [place].  I will send in antibiotics. OK for dental cleaning on Feb 9?

## 2021-08-29 NOTE — Telephone Encounter (Signed)
Pt called requesting a call back. Pt has some medical questions about pre meds for dental work. Please call pt at [phone].

## 2021-08-29 NOTE — Telephone Encounter (Signed)
I called patient and advised. Amoxicillin 500mg  sent to [facility] in [place].

## 2021-09-15 ENCOUNTER — Telehealth: Payer: Self-pay | Admitting: Orthopaedic Surgery

## 2021-09-15 NOTE — Telephone Encounter (Signed)
Pt called. States that "he was suppose to have an appt with Dr.XU tomorrow." I don't see the appt but he would like someone to call him.

## 2021-09-16 ENCOUNTER — Telehealth: Payer: Self-pay | Admitting: Orthopaedic Surgery

## 2021-09-16 NOTE — Telephone Encounter (Signed)
I am not aware. Please advise.

## 2021-09-16 NOTE — Telephone Encounter (Signed)
Patient called asked if he can get a note to return back to work Monday without restrictions. Patient said he can pick up note at the front desk. The number to contact patient is [phone]

## 2021-09-16 NOTE — Telephone Encounter (Signed)
yes

## 2021-09-16 NOTE — Telephone Encounter (Signed)
Note completed and given to the pt. 

## 2021-11-08 ENCOUNTER — Other Ambulatory Visit: Payer: Self-pay | Admitting: Physician Assistant

## 2021-11-08 ENCOUNTER — Telehealth: Payer: Self-pay | Admitting: Orthopaedic Surgery

## 2021-11-08 MED ORDER — AMOXICILLIN 500 MG PO CAPS: ORAL_CAPSULE | ORAL | 2 refills | Status: AC

## 2021-11-08 NOTE — Telephone Encounter (Signed)
sent 

## 2021-11-08 NOTE — Telephone Encounter (Signed)
Patient called advised he is going to the dentist in a couple of weeks and needed to know if he needed an antibiotic? Patient said he had surgery 06/19/2021.  Patient uses [facility] on [address]  [place]    The number to contact patient is [phone]  ?

## 2021-11-09 NOTE — Telephone Encounter (Signed)
Patient aware.

## 2021-12-09 ENCOUNTER — Telehealth: Payer: Self-pay | Admitting: Orthopaedic Surgery

## 2021-12-09 NOTE — Telephone Encounter (Signed)
What questions does he have?

## 2021-12-09 NOTE — Telephone Encounter (Signed)
Pt called requesting a call from [place]. Pt has medical question. Please call pt at [phone] ?

## 2021-12-12 NOTE — Telephone Encounter (Signed)
Called patient no answer. NEED to know what questions he has... ?

## 2021-12-12 NOTE — Telephone Encounter (Signed)
He has a corn on his big toe---he is going in to the foot dr... Does he need antiboitics ?

## 2022-05-30 IMAGING — DX DG KNEE 1-2V PORT*L*
2 series · 2 of 2 positions shown · non-contrast
Comparison: None.

CLINICAL DATA: Post op

EXAM:
PORTABLE LEFT KNEE - 1-2 VIEW

[knee ap]
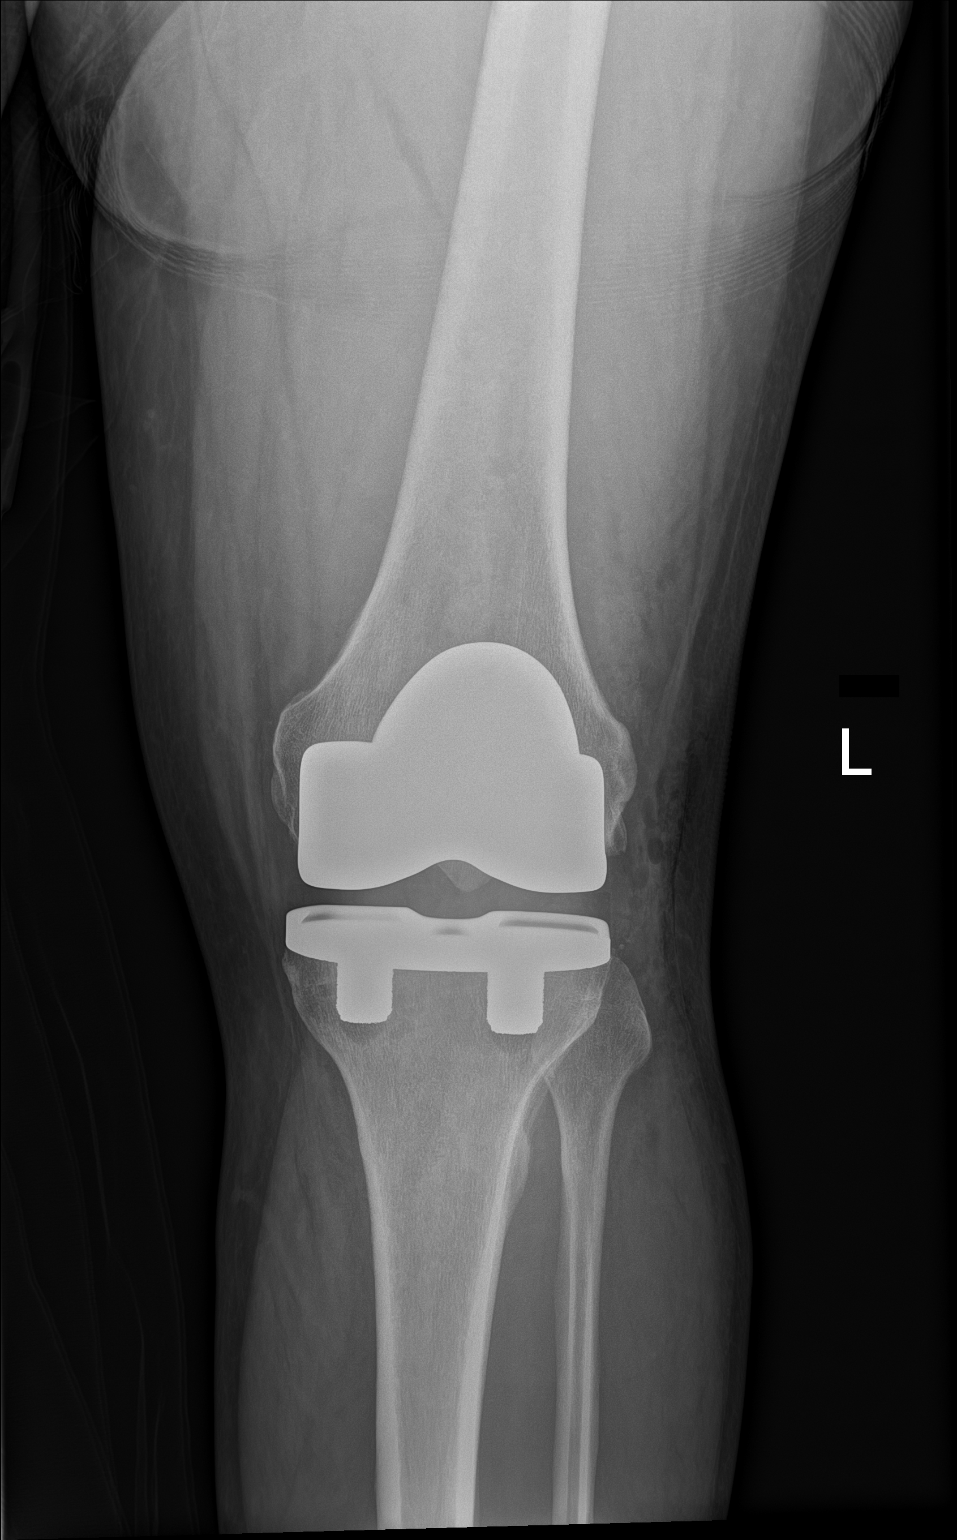

[knee lat]
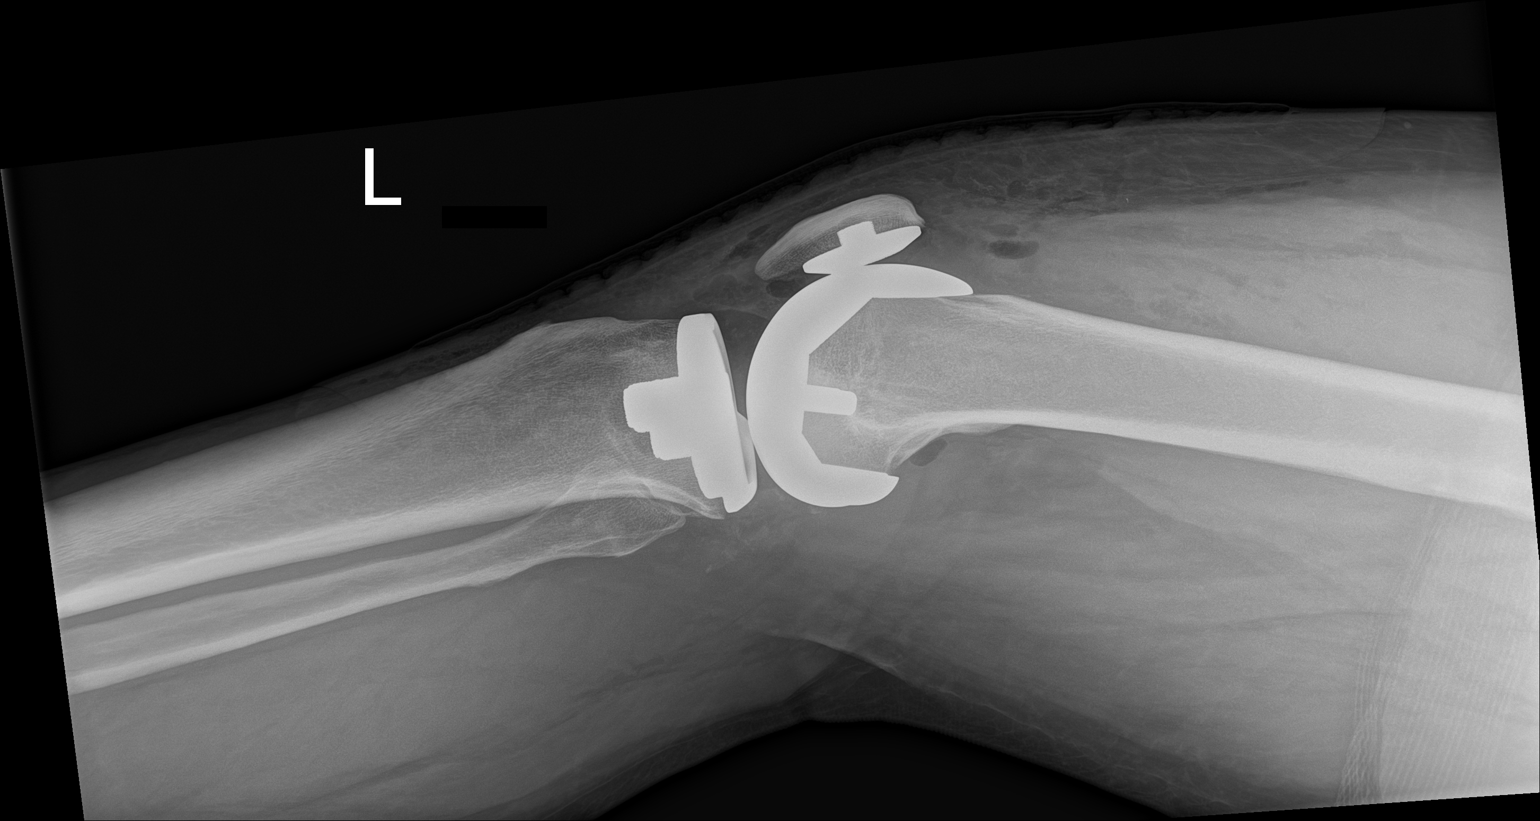

[2 of 2 positions shown; findings below may reference images not displayed]

FINDINGS: Postop total knee arthroplasty. Prosthetic components appear well
seated. Expected postsurgical change in the soft tissues.
IMPRESSION: No complication following total knee arthroplasty.

## 2023-11-16 ENCOUNTER — Other Ambulatory Visit (INDEPENDENT_AMBULATORY_CARE_PROVIDER_SITE_OTHER): Payer: Self-pay

## 2023-11-16 ENCOUNTER — Ambulatory Visit: Admitting: Orthopaedic Surgery

## 2023-11-16 ENCOUNTER — Encounter: Payer: Self-pay | Admitting: Orthopaedic Surgery

## 2023-11-16 DIAGNOSIS — M79605 Pain in left leg: Secondary | ICD-10-CM | POA: Diagnosis not present

## 2023-11-16 NOTE — Progress Notes (Signed)
 Office Visit Note   Patient: Malik James           Date of Birth: 09/04/1960           MRN: [id number] Visit Date: 11/16/2023              Requested by: No referring provider defined for this encounter. PCP: Pcp, No   Assessment & Plan: Visit Diagnoses:  1. Pain in left leg     Plan: Malik James is a 63 year old gentleman with left proximal hamstrings tendinopathy.  Disease process explained and treatment options were reviewed.  He will work on hamstring stretching.  Use a doughnut or a tennis ball while driving.  Follow-up as needed.  Follow-Up Instructions: No follow-ups on file.   Orders:  Orders Placed This Encounter  Procedures   XR Lumbar Spine 2-3 Views   No orders of the defined types were placed in this encounter.     Procedures: No procedures performed   Clinical Data: No additional findings.   Subjective: Chief Complaint  Patient presents with   Left Leg - Pain    HPI Malik James is a 63 year old gentleman who comes in for evaluation of left buttock pain for about 2 weeks.  He has had issues with plantar fasciitis and he feels that this is caused him to walk differently.  He is states that it hurts directly on the ischial tuberosity specially when he has to drive long distance.  He denies any trauma.  Denies any numbness and tingling.  Ibuprofen does not help. Review of Systems  Constitutional: Negative.   HENT: Negative.    Eyes: Negative.   Respiratory: Negative.    Cardiovascular: Negative.   Gastrointestinal: Negative.   Endocrine: Negative.   Genitourinary: Negative.   Skin: Negative.   Allergic/Immunologic: Negative.   Neurological: Negative.   Hematological: Negative.   Psychiatric/Behavioral: Negative.    All other systems reviewed and are negative.    Objective: Vital Signs: There were no vitals taken for this visit.  Physical Exam Vitals and nursing note reviewed.  Constitutional:      Appearance: He is well-developed.  Pulmonary:      Effort: Pulmonary effort is normal.  Abdominal:     Palpations: Abdomen is soft.  Skin:    General: Skin is warm.  Neurological:     Mental Status: He is alert and oriented to person, place, and time.  Psychiatric:        Behavior: Behavior normal.        Thought Content: Thought content normal.        Judgment: Judgment normal.     Ortho Exam Exam of the left hip shows tenderness to the ischial tuberosity and the proximal hamstring.  He does exhibit hamstring tightness with increased popliteal angle.  Otherwise exam is unremarkable. Specialty Comments:  No specialty comments available.  Imaging: XR Lumbar Spine 2-3 Views Result Date: 11/16/2023 X-rays of the spine show mild diffuse degenerative changes.    PMFS History: Patient Active Problem List   Diagnosis Date Noted   Status post total left knee replacement 06/24/2021   Primary osteoarthritis of left knee 11/25/2020   Past Medical History:  Diagnosis Date   Sleep apnea     No family history on file.  Past Surgical History:  Procedure Laterality Date   CARDIAC CATHETERIZATION  10/20/2019   10/20/19 ([facility]): Normal LAD, LCx, RCA. 30% small-medium sized D1. Minor luminal irrgularities LCX. OM branches free of disease. LVEF > 55%. Juanda Crumble  MR, no gradient across AV.   TONSILLECTOMY     TOTAL KNEE ARTHROPLASTY Left 06/24/2021   Procedure: LEFT TOTAL KNEE ARTHROPLASTY;  Surgeon: Tarry Kos, MD;  Location: [facility] OR;  Service: Orthopedics;  Laterality: Left;   TRANSTHORACIC ECHOCARDIOGRAM  10/17/2019   TTE 10/17/19 ([facility]): LVEF 60-65%, mild diastolic dysfunction, mild TR/MR.   Social History   Occupational History   Not on file  Tobacco Use   Smoking status: Never   Smokeless tobacco: Never  Substance and Sexual Activity   Alcohol use: Not Currently   Drug use: Not Currently   Sexual activity: Not on file

## 2024-06-16 ENCOUNTER — Encounter: Payer: Self-pay | Admitting: Radiology
